# Patient Record
Sex: Female | Born: 1970 | Race: White | Hispanic: No | Smoking: Never smoker
Health system: Southern US, Community
[De-identification: ages and names within clinical notes are randomized; demographics above are authoritative.]

## PROBLEM LIST (undated history)

## (undated) DIAGNOSIS — R011 Cardiac murmur, unspecified: Secondary | ICD-10-CM

## (undated) DIAGNOSIS — D649 Anemia, unspecified: Secondary | ICD-10-CM

## (undated) HISTORY — PX: TUBAL LIGATION: SHX77

---

## 2015-03-14 DIAGNOSIS — Z411 Encounter for cosmetic surgery: Secondary | ICD-10-CM | POA: Insufficient documentation

## 2016-04-18 DIAGNOSIS — D500 Iron deficiency anemia secondary to blood loss (chronic): Secondary | ICD-10-CM | POA: Insufficient documentation

## 2016-04-18 DIAGNOSIS — N939 Abnormal uterine and vaginal bleeding, unspecified: Secondary | ICD-10-CM | POA: Insufficient documentation

## 2016-04-18 DIAGNOSIS — N926 Irregular menstruation, unspecified: Secondary | ICD-10-CM | POA: Insufficient documentation

## 2017-04-08 ENCOUNTER — Encounter (HOSPITAL_BASED_OUTPATIENT_CLINIC_OR_DEPARTMENT_OTHER): Payer: Self-pay | Admitting: Emergency Medicine

## 2017-04-08 ENCOUNTER — Other Ambulatory Visit: Payer: Self-pay

## 2017-04-08 ENCOUNTER — Emergency Department (HOSPITAL_BASED_OUTPATIENT_CLINIC_OR_DEPARTMENT_OTHER)
Admission: EM | Admit: 2017-04-08 | Discharge: 2017-04-09 | Disposition: A | Discharge status: Home / Self Care | Payer: Self-pay | Attending: Emergency Medicine | Admitting: Emergency Medicine

## 2017-04-08 DIAGNOSIS — W260XXA Contact with knife, initial encounter: Secondary | ICD-10-CM | POA: Insufficient documentation

## 2017-04-08 DIAGNOSIS — S61211A Laceration without foreign body of left index finger without damage to nail, initial encounter: Secondary | ICD-10-CM | POA: Insufficient documentation

## 2017-04-08 DIAGNOSIS — Y9389 Activity, other specified: Secondary | ICD-10-CM | POA: Insufficient documentation

## 2017-04-08 DIAGNOSIS — Y998 Other external cause status: Secondary | ICD-10-CM | POA: Insufficient documentation

## 2017-04-08 DIAGNOSIS — Y929 Unspecified place or not applicable: Secondary | ICD-10-CM | POA: Insufficient documentation

## 2017-04-08 MED ORDER — LIDOCAINE-EPINEPHRINE 2 %-1:100000 IJ SOLN
INTRAMUSCULAR | Status: AC
  Administered 2017-04-09: 1 mL
  Filled 2017-04-08: qty 1

## 2017-04-08 NOTE — ED Provider Notes (Signed)
[facility] Provider Note   CSN: [id number] Arrival date & time: 04/08/17  2159     History   Chief Complaint Chief Complaint  Patient presents with  . Extremity Laceration    HPI Ashley Shah is a 47 y.o. female.  47 yo F with a chief complaint of a finger laceration.  The patient was trying to open a package with a knife and she cut through the package and into her finger.  She has had trouble controlling the bleeding at home.  Tetanus is up-to-date.   Denies other injury.  The history is provided by the patient and the spouse.  Illness  This is a new problem. The current episode started 3 to 5 hours ago. The problem occurs constantly. The problem has not changed since onset.Pertinent negatives include no chest pain, no headaches and no shortness of breath. Nothing aggravates the symptoms. Nothing relieves the symptoms. She has tried nothing for the symptoms. The treatment provided no relief.    History reviewed. No pertinent past medical history.  There are no active problems to display for this patient.   History reviewed. No pertinent surgical history.   OB History   None      Home Medications    Prior to Admission medications   Not on File    Family History No family history on file.  Social History Social History   Tobacco Use  . Smoking status: Never Smoker  . Smokeless tobacco: Never Used  Substance Use Topics  . Alcohol use: Not Currently    Frequency: Never  . Drug use: Never     Allergies   Sulfa antibiotics   Review of Systems Review of Systems  Constitutional: Negative for chills and fever.  HENT: Negative for congestion and rhinorrhea.   Eyes: Negative for redness and visual disturbance.  Respiratory: Negative for shortness of breath and wheezing.   Cardiovascular: Negative for chest pain and palpitations.  Gastrointestinal: Negative for nausea and vomiting.  Genitourinary: Negative for dysuria  and urgency.  Musculoskeletal: Negative for arthralgias and myalgias.  Skin: Positive for wound. Negative for pallor.  Neurological: Negative for dizziness and headaches.     Physical Exam Updated Vital Signs BP 131/82 (BP Location: Right Arm)   Pulse 85   Temp 97.7 F (36.5 C) (Oral)   Resp 18   Ht 5\' 5"  (1.651 m)   Wt 59 kg (130 lb)   SpO2 100%   BMI 21.63 kg/m   Physical Exam  Constitutional: She is oriented to person, place, and time. She appears well-developed and well-nourished. No distress.  HENT:  Head: Normocephalic and atraumatic.  Eyes: Pupils are equal, round, and reactive to light. EOM are normal.  Neck: Normal range of motion. Neck supple.  Cardiovascular: Normal rate and regular rhythm. Exam reveals no gallop and no friction rub.  No murmur heard. Pulmonary/Chest: Effort normal. She has no wheezes. She has no rales.  Abdominal: Soft. She exhibits no distension. There is no tenderness.  Musculoskeletal: She exhibits no edema or tenderness.  3 cm laceration to the left index finger up under the nail sparing the nail bed no nail damage.  Neurological: She is alert and oriented to person, place, and time.  Skin: Skin is warm and dry. She is not diaphoretic.  Psychiatric: She has a normal mood and affect. Her behavior is normal.  Nursing note and vitals reviewed.    ED Treatments / Results  Labs (all labs ordered  are listed, but only abnormal results are displayed) Labs Reviewed - No data to display  EKG None  Radiology No results found.  Procedures .Marland KitchenLaceration Repair Date/Time: 04/08/2017 11:43 PM Performed by: Deno Etienne, DO Authorized by: Deno Etienne, DO   Consent:    Consent obtained:  Verbal   Consent given by:  Patient   Risks discussed:  Infection, pain, poor cosmetic result, poor wound healing, need for additional repair, nerve damage and vascular damage   Alternatives discussed:  No treatment, delayed treatment and observation Anesthesia  (see MAR for exact dosages):    Anesthesia method:  Nerve block   Block needle gauge:  25 G   Block anesthetic:  Lidocaine 2% WITH epi   Block technique:  Digital   Block injection procedure:  Anatomic landmarks identified and anatomic landmarks palpated   Block outcome:  Anesthesia achieved Laceration details:    Location:  Finger   Finger location:  L index finger Repair type:    Repair type:  Complex Pre-procedure details:    Preparation:  Patient was prepped and draped in usual sterile fashion Exploration:    Limited defect created (wound extended): no     Hemostasis achieved with:  Direct pressure and epinephrine   Wound exploration: entire depth of wound probed and visualized     Contaminated: no   Treatment:    Area cleansed with:  Soap and water   Amount of cleaning:  Extensive   Irrigation solution:  Tap water   Irrigation method:  Tap   Visualized foreign bodies/material removed: no     Debridement:  Minimal   Undermining:  None   Scar revision: no   Skin repair:    Repair method:  Sutures   Suture size:  4-0   Suture material:  Nylon   Suture technique:  Simple interrupted   Number of sutures:  5 Approximation:    Approximation:  Close Post-procedure details:    Dressing:  Open (no dressing)   Patient tolerance of procedure:  Tolerated well, no immediate complications .Nerve Block Date/Time: 04/08/2017 11:45 PM Performed by: Deno Etienne, DO Authorized by: Deno Etienne, DO   Consent:    Consent obtained:  Verbal   Consent given by:  Patient   Risks discussed:  Allergic reaction, infection, intravenous injection, bleeding, pain and nerve damage   Alternatives discussed:  No treatment and alternative treatment Indications:    Indications:  Procedural anesthesia Location:    Body area:  Upper extremity   Upper extremity nerve blocked: digital block.   Laterality:  Left Pre-procedure details:    Skin preparation:  2% chlorhexidine   Preparation: Patient was  prepped and draped in usual sterile fashion   Skin anesthesia (see MAR for exact dosages):    Skin anesthesia method:  Local infiltration   Local anesthetic:  Lidocaine 2% WITH epi Procedure details (see MAR for exact dosages):    Block needle gauge:  25 G   Anesthetic injected:  Lidocaine 2% WITH epi   Steroid injected:  None   Additive injected:  None   Injection procedure:  Anatomic landmarks identified, anatomic landmarks palpated and negative aspiration for blood   Paresthesia:  None Post-procedure details:    Dressing:  None   Outcome:  Anesthesia achieved   Patient tolerance of procedure:  Tolerated well, no immediate complications   (including critical care time)  Medications Ordered in ED Medications  lidocaine-EPINEPHrine (XYLOCAINE W/EPI) 2 %-1:100000 (with pres) injection (has no administration in time  range)     Initial Impression / Assessment and Plan / ED Course  I have reviewed the triage vital signs and the nursing notes.  Pertinent labs & imaging results that were available during my care of the patient were reviewed by me and considered in my medical decision making (see chart for details).     46 yo F with a finger laceration.  This is just underneath the nail.  Not a evolving the nailbed or the nail itself.  Suture through the nail, PCP follow-up.  11:48 PM:  I have discussed the diagnosis/risks/treatment options with the patient and family and believe the pt to be eligible for discharge home to follow-up with PCP. We also discussed returning to the ED immediately if new or worsening sx occur. We discussed the sx which are most concerning (e.g., sudden worsening pain, fever, inability to tolerate by mouth) that necessitate immediate return. Medications administered to the patient during their visit and any new prescriptions provided to the patient are listed below.  Medications given during this visit Medications  lidocaine-EPINEPHrine (XYLOCAINE W/EPI) 2  %-1:100000 (with pres) injection (has no administration in time range)     The patient appears reasonably screen and/or stabilized for discharge and I doubt any other medical condition or other [facility] requiring further screening, evaluation, or treatment in the ED at this time prior to discharge.    Final Clinical Impressions(s) / ED Diagnoses   Final diagnoses:  Laceration of left index finger without foreign body without damage to nail, initial encounter    ED Discharge Orders    None       Deno Etienne, DO 04/08/17 2348

## 2017-04-08 NOTE — ED Triage Notes (Signed)
Pt cut left index finger with knife

## 2017-04-09 NOTE — ED Notes (Signed)
Dry dressing applied to left index finger. No bleeding noted.

## 2018-07-24 ENCOUNTER — Encounter (HOSPITAL_BASED_OUTPATIENT_CLINIC_OR_DEPARTMENT_OTHER): Payer: Self-pay

## 2018-07-24 ENCOUNTER — Other Ambulatory Visit: Payer: Self-pay

## 2018-07-24 ENCOUNTER — Emergency Department (HOSPITAL_BASED_OUTPATIENT_CLINIC_OR_DEPARTMENT_OTHER)
Admission: EM | Admit: 2018-07-24 | Discharge: 2018-07-25 | Disposition: A | Discharge status: Home / Self Care | Payer: Self-pay | Attending: Emergency Medicine | Admitting: Emergency Medicine

## 2018-07-24 DIAGNOSIS — N7011 Chronic salpingitis: Secondary | ICD-10-CM

## 2018-07-24 DIAGNOSIS — N83202 Unspecified ovarian cyst, left side: Secondary | ICD-10-CM | POA: Insufficient documentation

## 2018-07-24 DIAGNOSIS — D259 Leiomyoma of uterus, unspecified: Secondary | ICD-10-CM

## 2018-07-24 DIAGNOSIS — R1032 Left lower quadrant pain: Secondary | ICD-10-CM

## 2018-07-24 DIAGNOSIS — R102 Pelvic and perineal pain: Secondary | ICD-10-CM

## 2018-07-24 NOTE — ED Triage Notes (Signed)
Pt c/o severe lower abd pain with nausea that started today. Pt currently taking Cipro for a bladder infection. Pt denies urinary symptoms.

## 2018-07-25 ENCOUNTER — Emergency Department (HOSPITAL_BASED_OUTPATIENT_CLINIC_OR_DEPARTMENT_OTHER): Payer: Self-pay

## 2018-07-25 ENCOUNTER — Emergency Department (HOSPITAL_COMMUNITY): Payer: Self-pay

## 2018-07-25 LAB — CBC WITH DIFFERENTIAL/PLATELET
Abs Immature Granulocytes: 0.02 10*3/uL (ref 0.00–0.07)
Basophils Absolute: 0 10*3/uL (ref 0.0–0.1)
Basophils Relative: 1 %
Eosinophils Absolute: 0.1 10*3/uL (ref 0.0–0.5)
Eosinophils Relative: 1 %
HCT: 35.7 % — ABNORMAL LOW (ref 36.0–46.0)
Hemoglobin: 11.1 g/dL — ABNORMAL LOW (ref 12.0–15.0)
Immature Granulocytes: 0 %
Lymphocytes Relative: 32 %
Lymphs Abs: 2.5 10*3/uL (ref 0.7–4.0)
MCH: 26.6 pg (ref 26.0–34.0)
MCHC: 31.1 g/dL (ref 30.0–36.0)
MCV: 85.6 fL (ref 80.0–100.0)
Monocytes Absolute: 0.7 10*3/uL (ref 0.1–1.0)
Monocytes Relative: 9 %
Neutro Abs: 4.5 10*3/uL (ref 1.7–7.7)
Neutrophils Relative %: 57 %
Platelets: 382 10*3/uL (ref 150–400)
RBC: 4.17 MIL/uL (ref 3.87–5.11)
RDW: 14.2 % (ref 11.5–15.5)
WBC: 7.8 10*3/uL (ref 4.0–10.5)
nRBC: 0 % (ref 0.0–0.2)

## 2018-07-25 LAB — URINALYSIS, ROUTINE W REFLEX MICROSCOPIC
Bilirubin Urine: NEGATIVE
Glucose, UA: NEGATIVE mg/dL
Hgb urine dipstick: NEGATIVE
Ketones, ur: NEGATIVE mg/dL
Leukocytes,Ua: NEGATIVE
Nitrite: NEGATIVE
Protein, ur: NEGATIVE mg/dL
Specific Gravity, Urine: >1.03 — ABNORMAL HIGH (ref 1.005–1.030)
pH: 5.5 (ref 5.0–8.0)

## 2018-07-25 LAB — BASIC METABOLIC PANEL
Anion gap: 11 (ref 5–15)
BUN: 13 mg/dL (ref 6–20)
CO2: 24 mmol/L (ref 22–32)
Calcium: 9.5 mg/dL (ref 8.9–10.3)
Chloride: 102 mmol/L (ref 98–111)
Creatinine, Ser: 0.93 mg/dL (ref 0.44–1.00)
GFR calc Af Amer: >60 mL/min (ref >60)
GFR calc non Af Amer: >60 mL/min (ref >60)
Glucose, Bld: 102 mg/dL — ABNORMAL HIGH (ref 70–99)
Potassium: 3.3 mmol/L — ABNORMAL LOW (ref 3.5–5.1)
Sodium: 137 mmol/L (ref 135–145)

## 2018-07-25 LAB — PREGNANCY, URINE: Preg Test, Ur: NEGATIVE

## 2018-07-25 MED ORDER — ONDANSETRON HCL 4 MG/2ML IJ SOLN
4.0000 mg | Freq: Once | INTRAMUSCULAR | Status: AC
  Administered 2018-07-25: 4 mg via INTRAVENOUS
  Filled 2018-07-25: qty 2

## 2018-07-25 MED ORDER — KETOROLAC TROMETHAMINE 30 MG/ML IJ SOLN
30.0000 mg | Freq: Once | INTRAMUSCULAR | Status: AC
  Administered 2018-07-25: 30 mg via INTRAVENOUS
  Filled 2018-07-25: qty 1

## 2018-07-25 MED ORDER — GADOBUTROL 1 MMOL/ML IV SOLN
6.0000 mL | Freq: Once | INTRAVENOUS | Status: AC | PRN
  Administered 2018-07-25: 6 mL via INTRAVENOUS

## 2018-07-25 MED ORDER — HYDROMORPHONE HCL 1 MG/ML IJ SOLN
1.0000 mg | Freq: Once | INTRAMUSCULAR | Status: AC
  Administered 2018-07-25: 1 mg via INTRAVENOUS
  Filled 2018-07-25: qty 1

## 2018-07-25 MED ORDER — ONDANSETRON 4 MG PO TBDP: 4.0000 mg | ORAL_TABLET | Freq: Three times a day (TID) | ORAL | 0 refills | Status: DC | PRN

## 2018-07-25 MED ORDER — FENTANYL CITRATE (PF) 100 MCG/2ML IJ SOLN
50.0000 ug | Freq: Once | INTRAMUSCULAR | Status: AC
  Administered 2018-07-25: 50 ug via INTRAVENOUS
  Filled 2018-07-25: qty 2

## 2018-07-25 MED ORDER — SODIUM CHLORIDE 0.9 % IV BOLUS
1000.0000 mL | Freq: Once | INTRAVENOUS | Status: AC
  Administered 2018-07-25: 1000 mL via INTRAVENOUS

## 2018-07-25 MED ORDER — HYDROMORPHONE HCL 1 MG/ML IJ SOLN
1.0000 mg | INTRAMUSCULAR | Status: DC | PRN
  Administered 2018-07-25 (×2): 1 mg via INTRAVENOUS
  Filled 2018-07-25 (×2): qty 1

## 2018-07-25 MED ORDER — ONDANSETRON HCL 4 MG/2ML IJ SOLN
4.0000 mg | Freq: Four times a day (QID) | INTRAMUSCULAR | Status: DC | PRN
  Administered 2018-07-25 (×2): 4 mg via INTRAVENOUS
  Filled 2018-07-25 (×2): qty 2

## 2018-07-25 MED ORDER — OXYCODONE HCL 5 MG PO TABS: 5.0000 mg | ORAL_TABLET | ORAL | 0 refills | Status: DC | PRN

## 2018-07-25 MED ORDER — MORPHINE SULFATE (PF) 4 MG/ML IV SOLN
4.0000 mg | Freq: Once | INTRAVENOUS | Status: AC
  Administered 2018-07-25: 4 mg via INTRAVENOUS
  Filled 2018-07-25: qty 1

## 2018-07-25 NOTE — ED Provider Notes (Signed)
  Provider Note MRN:  [id number]  Arrival date & time: 07/25/18    ED Course and Medical Decision Making  Assumed care from Dr. Leonides Schanz at shift change.  48 year old female with lower pelvic pain, large ovarian cyst found on ultrasound, continued concern for possible ovarian torsion.  Will need MRI of the pelvis, awaiting results.  2:30 PM update: Unfortunate delays in obtaining MRI results.  With LOC, MRI is not consistent with ovarian torsion.  Pain is best explained by hydrosalpinx and possibly also contributing is patient's large fibroids.  MRI mentions the possibility of infection but the radiologist felt this was unlikely.  Patient has no signs or symptoms of infection.  She is appropriate for outpatient GYN follow-up and symptomatic management.  After the discussed management above, the patient was determined to be safe for discharge.  The patient was in agreement with this plan and all questions regarding their care were answered.  ED return precautions were discussed and the patient will return to the ED with any significant worsening of condition.    Final Clinical Impressions(s) / ED Diagnoses     ICD-10-CM   1. Left ovarian cyst  N83.202   2. LLQ pain  R10.32 US PELVIC DOPPLER (TORSION R/O OR MASS ARTERIAL FLOW)    US PELVIS TRANSVAGINAL NON-OB (TV ONLY)    ED Discharge Orders    None       Barth Kirks. Sedonia Small, [place] [email or web address]    Maudie Flakes, MD 07/25/18 [phone]

## 2018-07-25 NOTE — Discharge Instructions (Signed)
You were evaluated in the Emergency Department and after careful evaluation, we did not find any emergent condition requiring admission or further testing in the hospital.  Your symptoms today seem to be due to a hydrosalpinx as well as uterine fibroids.  Your ovaries appear to have normal blood flow.  We recommend close GYN follow-up for further management considerations.  Please use the pain and nausea medication as needed at home.  Please return to the Emergency Department if you experience any worsening of your condition.  We encourage you to follow up with a primary care provider.  Thank you for allowing [place] to be a part of your care.

## 2018-07-25 NOTE — ED Notes (Signed)
Pt vomiting.

## 2018-07-25 NOTE — ED Notes (Signed)
Pt transported to MRI 

## 2018-07-25 NOTE — ED Provider Notes (Addendum)
[facility] Provider Note   CSN: [id number] Arrival date & time: 07/24/18  2324    History   Chief Complaint Chief Complaint  Patient presents with   Abdominal Pain    HPI Ashley Shah is a 48 y.o. female.     HPI  This is a 48 year old female who presents with left lower quadrant pain.  Patient reports onset of pain earlier this evening.  She states that she has pain in her left lower quadrant and her left flank.  It is sharp and comes and goes.  She has had similar pain in the past that she has associated with her.  But she is not currently on her period.  She reports nausea without vomiting.  Patient states that she has had recent recurrent urinary tract infections.  She just started ciprofloxacin on Thursday for a presumed urinary tract infection but did not have urine taken.  Patient rates her pain at 10 out of 10.  She states "it is as bad as when I had a ruptured ectopic."  No known history of kidney stones.  Patient took Midol with minimal relief.  Denies fevers but reports chills.  History reviewed. No pertinent past medical history.  There are no active problems to display for this patient.   Past Surgical History:  Procedure Laterality Date   TUBAL LIGATION       OB History   No obstetric history on file.      Home Medications    Prior to Admission medications   Medication Sig Start Date End Date Taking? Authorizing Provider  zolpidem (AMBIEN) 10 MG tablet Take 10 mg by mouth at bedtime as needed for sleep.    [provider]    Family History No family history on file.  Social History Social History   Tobacco Use   Smoking status: Never Smoker   Smokeless tobacco: Never Used  Substance Use Topics   Alcohol use: Not Currently    Frequency: Never   Drug use: Never     Allergies   Sulfa antibiotics   Review of Systems Review of Systems  Constitutional: Negative for fever.  Cardiovascular:  Negative for chest pain.  Gastrointestinal: Positive for abdominal pain and nausea. Negative for diarrhea and vomiting.  Genitourinary: Positive for dysuria and flank pain.  Musculoskeletal: Negative for back pain.     Physical Exam Updated Vital Signs BP 133/88 (BP Location: Left Arm)    Pulse 94    Temp 98.1 F (36.7 C) (Oral)    Resp (!) 24    Ht 1.651 m (5\' 5" )    Wt 61.2 kg    LMP 07/05/2018    SpO2 100%    BMI 22.47 kg/m   Physical Exam Vitals signs and nursing note reviewed.  Constitutional:      Appearance: She is well-developed. She is not ill-appearing.  HENT:     Head: Normocephalic and atraumatic.  Neck:     Musculoskeletal: Neck supple.  Cardiovascular:     Rate and Rhythm: Normal rate and regular rhythm.     Heart sounds: Normal heart sounds.  Pulmonary:     Effort: Pulmonary effort is normal. No respiratory distress.     Breath sounds: No wheezing.  Abdominal:     General: Bowel sounds are normal.     Palpations: Abdomen is soft.     Tenderness: There is abdominal tenderness in the left lower quadrant. There is no right CVA tenderness, left  CVA tenderness, guarding or rebound.  Skin:    General: Skin is warm and dry.  Neurological:     Mental Status: She is alert and oriented to person, place, and time.  Psychiatric:        Mood and Affect: Mood normal.      ED Treatments / Results  Labs (all labs ordered are listed, but only abnormal results are displayed) Labs Reviewed  URINALYSIS, ROUTINE W REFLEX MICROSCOPIC - Abnormal; Notable for the following components:      Result Value   APPearance HAZY (*)    Specific Gravity, Urine >1.030 (*)    All other components within normal limits  CBC WITH DIFFERENTIAL/PLATELET - Abnormal; Notable for the following components:   Hemoglobin 11.1 (*)    HCT 35.7 (*)    All other components within normal limits  BASIC METABOLIC PANEL - Abnormal; Notable for the following components:   Potassium 3.3 (*)    Glucose,  Bld 102 (*)    All other components within normal limits  PREGNANCY, URINE    EKG None  Radiology Ct Renal Stone Study  Result Date: 07/25/2018 CLINICAL DATA:  48 year old female with left lower quadrant and flank pain. EXAM: CT ABDOMEN AND PELVIS WITHOUT CONTRAST TECHNIQUE: Multidetector CT imaging of the abdomen and pelvis was performed following the standard protocol without IV contrast. COMPARISON:  None. FINDINGS: Evaluation of this exam is limited in the absence of intravenous contrast. Lower chest: The visualized lung bases are clear. No intra-abdominal free air or free fluid. Hepatobiliary: Subcentimeter hypodense focus in the dome of the liver is too small to characterize. The liver is otherwise unremarkable. No intrahepatic biliary ductal dilatation. There is a stone in the gallbladder. No pericholecystic fluid or evidence of acute cholecystitis by CT. Ultrasound may provide better evaluation if there is high clinical concern for acute cholecystitis. Pancreas: Unremarkable. No pancreatic ductal dilatation or surrounding inflammatory changes. Spleen: Normal in size without focal abnormality. Adrenals/Urinary Tract: The adrenal glands, kidneys, and visualized ureters appear unremarkable. The urinary bladder is collapsed. Stomach/Bowel: Small hiatal hernia. There is no bowel obstruction or active inflammation. The appendix is normal. Vascular/Lymphatic: The abdominal aorta and IVC are grossly unremarkable on this noncontrast CT. No portal venous gas. There is no adenopathy. Reproductive: The uterus is enlarged with multiple fibroids. The largest fibroid is in the posterior body measuring up to 6.5 cm with possible some degenerative changes. There is a 6 cm left ovarian cyst or cystic lesion. MRI or pelvic ultrasound may provide better evaluation uterus and the adnexa on a nonemergent basis. Other: Small fat containing umbilical hernia. Musculoskeletal: Mild levoscoliosis. No acute osseous  pathology. IMPRESSION: 1. No acute intra-abdominal or pelvic pathology. No bowel obstruction or active inflammation. Normal appendix. 2. Enlarged myomatous uterus. A 6 cm left ovarian cyst or cystic lesion. MRI or pelvic ultrasound may provide better evaluation uterus and the adnexa on a nonemergent basis. 3. Cholelithiasis. 4. No hydronephrosis or nephrolithiasis. Electronically Signed   By: Anner Crete M.D.   On: 07/25/2018 01:11    Procedures Procedures (including critical care time)  Medications Ordered in ED Medications  sodium chloride 0.9 % bolus 1,000 mL (0 mLs Intravenous Stopped 07/25/18 0136)  ondansetron (ZOFRAN) injection 4 mg (4 mg Intravenous Given 07/25/18 0024)  ketorolac (TORADOL) 30 MG/ML injection 30 mg (30 mg Intravenous Given 07/25/18 0024)  morphine 4 MG/ML injection 4 mg (4 mg Intravenous Given 07/25/18 0125)     Initial Impression / Assessment and  Plan / ED Course  I have reviewed the triage vital signs and the nursing notes.  Pertinent labs & imaging results that were available during my care of the patient were reviewed by me and considered in my medical decision making (see chart for details).        Patient presents with left lower quadrant pain.  She is overall nontoxic-appearing.  Vital signs are reassuring.  Fairly acute in onset.  Considerations include kidney stone, UTI, ovarian pathology.  Patient was given pain and nausea medication.  She was given fluids.  Urinalysis without obvious UTI.  She is currently on ciprofloxacin.  No significant leukocytosis.  CT stone study is negative for kidney stone but does show a large left ovarian cyst.  Patient is having ongoing and intermittent pain.  Given size of the cyst, she is at risk for ovarian torsion.  Discussed transfer to [facility] for an ultrasound to rule out torsion.  Patient is clinically stable and requesting to go by private vehicle.  I feel this is reasonable and her husband will transport her.  We  discussed risks and benefits of transport.  Final Clinical Impressions(s) / ED Diagnoses   Final diagnoses:  LLQ pain  Left ovarian cyst    ED Discharge Orders    None       Kamyrah Feeser, Barbette Hair, MD 07/25/18 0151    Merryl Hacker, MD 07/25/18 [phone]

## 2018-07-25 NOTE — ED Provider Notes (Signed)
4:45 AM Patient is a 48 year old female who was transferred from [place] with complaints of sudden onset left-sided pelvic pain.  Described as feeling similar to her previous ectopic pregnancy that she has had on the right side.  CT scan at [facility] showed a 6 cm cystic lesion around the left ovary.  Sent here for ultrasound to rule out torsion.  Ultrasound performed does not visualize the left ovary secondary to an enlarged fibroid uterus.  Patient does not have a local OB/GYN.  She reports her pain is poorly controlled after Toradol, fentanyl and morphine.  Will give Dilaudid and Zofran.  D/w Dr. Elly Modena on call for unassigned OB/GYN.  Appreciate her help.  She states that if this was an ovarian cyst, they do not normally admit patients for pain control or perform cystectomies normally in the hospital.  This would be outpatient follow-up.  Unfortunately at this time we have not ruled out torsion.  D/w radiology.  Appreciate radiology help.  He states that a transabdominal ultrasound could be performed to rule out torsion but this would need to be done with a full bladder.  He states it may be faster to perform a pelvic MRI with and without contrast to rule out torsion and that this would not be an acceptable study to rule torsion out today.  Patient comfortable with this plan will be kept n.p.o. at this time.  7:40 AM  Pt awaiting pelvic MRI.  We have talked to MRI technician as we are trying to rule out surgical emergent pathology.  Patient will be taken to MRI next.  Patient's pain has been controlled with IV Dilaudid.  Signed out to Dr. Sedonia Small.  I reviewed all nursing notes, vitals, pertinent previous records, EKGs, lab and urine results, imaging (as available).    Cassiopeia Florentino, Delice Bison, DO 07/25/18 [phone]

## 2018-07-25 NOTE — ED Notes (Signed)
Waiting on preg results prior to CT scan per radiology protocol

## 2018-07-25 NOTE — ED Notes (Signed)
MD notified of pt's request for pain medication

## 2018-07-25 NOTE — ED Notes (Signed)
Report given to Joanell Rising, RN [facility] ed triage nurse. Illene Silver, Charge RN made aware of POV transfer

## 2018-07-25 NOTE — ED Notes (Signed)
ED Provider at bedside. 

## 2018-07-25 NOTE — ED Notes (Signed)
Patient verbalizes understanding of discharge instructions. Opportunity for questioning and answers were provided. Armband removed by staff, pt discharged from ED.  

## 2019-01-24 ENCOUNTER — Encounter: Payer: Self-pay | Admitting: Obstetrics & Gynecology

## 2019-02-07 ENCOUNTER — Other Ambulatory Visit: Payer: Self-pay

## 2019-02-07 ENCOUNTER — Ambulatory Visit: Payer: 59 | Admitting: Obstetrics & Gynecology

## 2019-02-07 ENCOUNTER — Other Ambulatory Visit: Payer: Self-pay | Admitting: Obstetrics & Gynecology

## 2019-02-07 ENCOUNTER — Encounter: Payer: Self-pay | Admitting: Obstetrics & Gynecology

## 2019-02-07 VITALS — BP 118/77 | HR 108 | Temp 98.4°F | Resp 16 | Ht 65.0 in | Wt 143.0 lb

## 2019-02-07 DIAGNOSIS — Z124 Encounter for screening for malignant neoplasm of cervix: Secondary | ICD-10-CM

## 2019-02-07 DIAGNOSIS — Z3202 Encounter for pregnancy test, result negative: Secondary | ICD-10-CM | POA: Diagnosis not present

## 2019-02-07 DIAGNOSIS — Z1151 Encounter for screening for human papillomavirus (HPV): Secondary | ICD-10-CM | POA: Diagnosis not present

## 2019-02-07 DIAGNOSIS — Z113 Encounter for screening for infections with a predominantly sexual mode of transmission: Secondary | ICD-10-CM | POA: Diagnosis not present

## 2019-02-07 DIAGNOSIS — D251 Intramural leiomyoma of uterus: Secondary | ICD-10-CM

## 2019-02-07 DIAGNOSIS — N926 Irregular menstruation, unspecified: Secondary | ICD-10-CM | POA: Diagnosis not present

## 2019-02-07 DIAGNOSIS — N898 Other specified noninflammatory disorders of vagina: Secondary | ICD-10-CM

## 2019-02-07 DIAGNOSIS — Z01812 Encounter for preprocedural laboratory examination: Secondary | ICD-10-CM

## 2019-02-07 DIAGNOSIS — R102 Pelvic and perineal pain: Secondary | ICD-10-CM

## 2019-02-07 DIAGNOSIS — D259 Leiomyoma of uterus, unspecified: Secondary | ICD-10-CM | POA: Insufficient documentation

## 2019-02-07 LAB — POCT URINE PREGNANCY: Preg Test, Ur: NEGATIVE

## 2019-02-07 NOTE — Progress Notes (Signed)
   Subjective:    Patient ID: Ashley Shah, female    DOB: [birth year], 49 y.o.   MRN: [id number]  HPI  Pt presents for pain after hospitalization in July 2020.  Pt known to have fibroid tumors of the uterus.  Pt has pain and heavy menses.  Pt's last menses was 2 weeks with spotting being the last week. Pt would like a hysterectomy and is considering BSO.   Pt has history of RSO due to ruptured ectopic.  Pt has not taken anything for the bleeding or pain.  Pt does have recurrent UTIs and recenly took cipro (PCP).    Review of Systems  Constitutional: Negative.   Respiratory: Negative.   Cardiovascular: Negative.   Gastrointestinal: Negative.   Genitourinary: Positive for dyspareunia, pelvic pain, vaginal bleeding and vaginal discharge.  Psychiatric/Behavioral: Negative.        Objective:   Physical Exam Vitals reviewed.  Constitutional:      General: She is not in acute distress.    Appearance: She is well-developed.  HENT:     Head: Normocephalic and atraumatic.  Eyes:     Conjunctiva/sclera: Conjunctivae normal.  Cardiovascular:     Rate and Rhythm: Normal rate.  Pulmonary:     Effort: Pulmonary effort is normal.  Abdominal:     General: Abdomen is flat. There is no distension.     Palpations: Abdomen is soft. There is no mass.     Tenderness: There is no abdominal tenderness. There is no guarding or rebound.  Genitourinary:    Comments: T[place] V Vagina:  Pink normal rugae, white thick discharge Cervix:  Very anterior, no lesion Uterus: Enlarged with posterior fibroid Adnexa:  No tenderness; exam limited by fibroid Skin:    General: Skin is warm and dry.  Neurological:     Mental Status: She is alert and oriented to person, place, and time.       Assessment & Plan:  49 yo female with chronic pelvic pain from enlarge fibroid uterus and  Left adnexal mass (MRI on chart form July 2020).  Pt would like hysterectomy and considering BSO. 1.  Pap smear.  Unclear when  she last had a pap smear.  Saw Angela Cox who may have done one. 2.  Endometrial biopsy:  Pt has had irregular menses. 3.  Will schedule with Dr. Ihor Dow.  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     40 mins spent face to face with patient with greater than 50% counseling.

## 2019-02-08 LAB — CYTOLOGY - PAP
Adequacy: ABSENT
Comment: NEGATIVE
Diagnosis: NEGATIVE
High risk HPV: NEGATIVE

## 2019-02-08 LAB — CERVICOVAGINAL ANCILLARY ONLY
Bacterial Vaginitis (gardnerella): NEGATIVE
Candida Glabrata: NEGATIVE
Candida Vaginitis: NEGATIVE
Chlamydia: NEGATIVE
Comment: NEGATIVE
Comment: NEGATIVE
Comment: NEGATIVE
Comment: NEGATIVE
Comment: NEGATIVE
Comment: NORMAL
Neisseria Gonorrhea: NEGATIVE
Trichomonas: NEGATIVE

## 2019-02-10 ENCOUNTER — Telehealth: Payer: Self-pay | Admitting: *Deleted

## 2019-02-10 NOTE — Telephone Encounter (Signed)
LM on voicemail of Endometrial biopsy results.  Polyp, and that Dr Maylene Roes would review and discuss at her next appt.

## 2019-02-10 NOTE — Telephone Encounter (Signed)
-----   Message from Guss Bunde, MD sent at 02/09/2019 12:55 PM EST ----- Diagnosis Endometrium, biopsy - ENDOMETRIOID-TYPE POLYP, BENIGN. Pt does not have my chart.  RN to call patient and tell results and that Dr. Ihor Dow will talk about it with her further at her appointment.

## 2019-02-17 ENCOUNTER — Encounter: Payer: Self-pay | Admitting: Obstetrics & Gynecology

## 2019-02-17 ENCOUNTER — Telehealth: Payer: Self-pay

## 2019-02-17 ENCOUNTER — Ambulatory Visit (INDEPENDENT_AMBULATORY_CARE_PROVIDER_SITE_OTHER): Payer: 59 | Admitting: Obstetrics & Gynecology

## 2019-02-17 ENCOUNTER — Other Ambulatory Visit: Payer: Self-pay

## 2019-02-17 VITALS — BP 113/78 | HR 94 | Ht 65.0 in | Wt 140.0 lb

## 2019-02-17 DIAGNOSIS — D219 Benign neoplasm of connective and other soft tissue, unspecified: Secondary | ICD-10-CM

## 2019-02-17 DIAGNOSIS — R102 Pelvic and perineal pain: Secondary | ICD-10-CM | POA: Diagnosis not present

## 2019-02-17 NOTE — Telephone Encounter (Signed)
Pt called the office wanting to discuss scheduling surgery. Pt made aware that she will receive a letter or a phone when the surgery is scheduled.Understanding was voiced. Tanny Harnack l Odus Clasby, CMA

## 2019-02-17 NOTE — Patient Instructions (Signed)
Total Laparoscopic Hysterectomy °A total laparoscopic hysterectomy is a minimally invasive surgery to remove the uterus and cervix. The fallopian tubes and ovaries can also be removed (bilateral salpingo-oophorectomy) during this surgery, if necessary. This procedure may be done to treat problems such as: °· Noncancerous growths in the uterus (uterine fibroids) that cause symptoms. °· A condition that causes the lining of the uterus (endometrium) to grow in other areas (endometriosis). °· Problems with pelvic support. This is caused by weakened muscles of the pelvis following vaginal childbirth or menopause. °· Cancer of the cervix, ovaries, uterus, or endometrium. °· Excessive (dysfunctional) uterine bleeding. °This surgery is performed by inserting a thin, lighted tube (laparoscope) and surgical instruments into small incisions in the abdomen. The laparoscope sends images to a monitor. The images help the health care provider perform the procedure. After this procedure, you will no longer be able to have a baby, and you will no longer have a menstrual period. °Tell a health care provider about: °· Any allergies you have. °· All medicines you are taking, including vitamins, herbs, eye drops, creams, and over-the-counter medicines. °· Any problems you or family members have had with anesthetic medicines. °· Any blood disorders you have. °· Any surgeries you have had. °· Any medical conditions you have. °· Whether you are pregnant or may be pregnant. °What are the risks? °Generally, this is a safe procedure. However, problems may occur, including: °· Infection. °· Bleeding. °· Blood clots in the legs or lungs. °· Allergic reactions to medicines. °· Damage to other structures or organs. °· The risk that the surgery may have to be switched to the regular one in which a large incision is made in the abdomen (abdominal hysterectomy). °What happens before the procedure? °Staying hydrated °Follow instructions from your  health care provider about hydration, which may include: °· Up to 2 hours before the procedure - you may continue to drink clear liquids, such as water, clear fruit juice, black coffee, and plain tea °Eating and drinking restrictions °Follow instructions from your health care provider about eating and drinking, which may include: °· 8 hours before the procedure - stop eating heavy meals or foods such as meat, fried foods, or fatty foods. °· 6 hours before the procedure - stop eating light meals or foods, such as toast or cereal. °· 6 hours before the procedure - stop drinking milk or drinks that contain milk. °· 2 hours before the procedure - stop drinking clear liquids. °Medicines °· Ask your health care provider about: °? Changing or stopping your regular medicines. This is especially important if you are taking diabetes medicines or blood thinners. °? Taking over-the-counter medicines, vitamins, herbs, and supplements. °? Taking medicines such as aspirin and ibuprofen. These medicines can thin your blood. Do not take these medicines unless your health care provider tells you to take them. °· You may be given antibiotic medicine to help prevent infection. °· You may be asked to take laxatives. °· You may be given medicines to help prevent nausea and vomiting after the procedure. °General instructions °· Ask your health care provider how your surgical site will be marked or identified. °· You may be asked to shower with a germ-killing soap. °· Do not use any products that contain nicotine or tobacco, such as cigarettes and e-cigarettes. If you need help quitting, ask your health care provider. °· You may have an exam or testing, such as an ultrasound to determine the size and shape of your pelvic organs. °·   You may have a blood or urine sample taken. °· This procedure can affect the way you feel about yourself. Talk with your health care provider about the physical and emotional changes hysterectomy may  cause. °· Plan to have someone take you home from the hospital or clinic. °· Plan to have a responsible adult care for you for at least 24 hours after you leave the hospital or clinic. This is important. °What happens during the procedure? °· To lower your risk of infection: °? Your health care team will wash or sanitize their hands. °? Your skin will be washed with soap. °? Hair may be removed from the surgical area. °· An IV will be inserted into one of your veins. °· You will be given one or more of the following: °? A medicine to help you relax (sedative). °? A medicine to make you fall asleep (general anesthetic). °· You will be given antibiotic medicine through your IV. °· A tube may be inserted down your throat to help you breathe during the procedure. °· A gas (carbon dioxide) will be used to inflate your abdomen to allow your surgeon to see inside of your abdomen. °· Three or four small incisions will be made in your abdomen. °· A laparoscope will be inserted into one of your incisions. Surgical instruments will be inserted through the other incisions in order to perform the procedure. °· Your uterus and cervix may be removed through your vagina or cut into small pieces and removed through the small incisions. Any other organs that need to be removed will also be removed this way. °· Carbon dioxide will be released from inside of your abdomen. °· Your incisions will be closed with stitches (sutures). °· A bandage (dressing) may be placed over your incisions. °The procedure may vary among health care providers and hospitals. °What happens after the procedure? °· Your blood pressure, heart rate, breathing rate, and blood oxygen level will be monitored until the medicines you were given have worn off. °· You will be given medicine for pain and nausea as needed. °· Do not drive for 24 hours if you received a sedative. °Summary °· Total Laparoscopic hysterectomy is a procedure to remove your uterus, cervix and  sometimes the fallopian tubes and ovaries. °· This procedure can affect the way you feel about yourself. Talk with your health care provider about the physical and emotional changes hysterectomy may cause. °· After this procedure, you will no longer be able to have a baby, and you will no longer have a menstrual period. °· You will be given pain medicine to control discomfort after this procedure. °This information is not intended to replace advice given to you by your health care provider. Make sure you discuss any questions you have with your health care provider. °Document Revised: 12/05/2016 Document Reviewed: 03/05/2016 °Elsevier Patient Education © 2020 [employer] °Total Laparoscopic Hysterectomy, Care After °This sheet gives you information about how to care for yourself after your procedure. Your health care provider may also give you more specific instructions. If you have problems or questions, contact your health care provider. °What can I expect after the procedure? °After the procedure, it is common to have: °· Pain and bruising around your incisions. °· A sore throat, if a breathing tube was used during surgery. °· Fatigue. °· Poor appetite. °· Less interest in sex. °If your ovaries were also removed, it is also common to have symptoms of menopause such as hot flashes, night sweats,   and lack of sleep (insomnia). °Follow these instructions at home: °Bathing °· Do not take baths, swim, or use a hot tub until your health care provider approves. You may need to only take showers for 2-3 weeks. °· Keep your bandage (dressing) dry until your health care provider says it can be removed. °Incision care ° °· Follow instructions from your health care provider about how to take care of your incisions. Make sure you: °? Wash your hands with soap and water before you change your dressing. If soap and water are not available, use hand sanitizer. °? Change your dressing as told by your health care  provider. °? Leave stitches (sutures), skin glue, or adhesive strips in place. These skin closures may need to stay in place for 2 weeks or longer. If adhesive strip edges start to loosen and curl up, you may trim the loose edges. Do not remove adhesive strips completely unless your health care provider tells you to do that. °· Check your incision area every day for signs of infection. Check for: °? Redness, swelling, or pain. °? Fluid or blood. °? Warmth. °? Pus or a bad smell. °Activity °· Get plenty of rest and sleep. °· Do not lift anything that is heavier than 10 lbs (4.5 kg) for one month after surgery, or as long as told by your health care provider. °· Do not drive or use heavy machinery while taking prescription pain medicine. °· Do not drive for 24 hours if you were given a medicine to help you relax (sedative). °· Return to your normal activities as told by your health care provider. Ask your health care provider what activities are safe for you. °Lifestyle ° °· Do not use any products that contain nicotine or tobacco, such as cigarettes and e-cigarettes. These can delay healing. If you need help quitting, ask your health care provider. °· Do not drink alcohol until your health care provider approves. °General instructions °· Do not douche, use tampons, or have sex for at least 6 weeks, or as told by your health care provider. °· Take over-the-counter and prescription medicines only as told by your health care provider. °· To monitor yourself for a fever, take your temperature at least once a day during recovery. °· If you struggle with physical or emotional changes after your procedure, speak with your health care provider or a therapist. °· To prevent or treat constipation while you are taking prescription pain medicine, your health care provider may recommend that you: °? Drink enough fluid to keep your urine clear or pale yellow. °? Take over-the-counter or prescription medicines. °? Eat foods that  are high in fiber, such as fresh fruits and vegetables, whole grains, and beans. °? Limit foods that are high in fat and processed sugars, such as fried and sweet foods. °· Keep all follow-up visits as told by your health care provider. This is important. °Contact a health care provider if: °· You have chills or a fever. °· You have redness, swelling, or pain around an incision. °· You have fluid or blood coming from an incision. °· Your incision feels warm to the touch. °· You have pus or a bad smell coming from an incision. °· An incision breaks open. °· You feel dizzy or light-headed. °· You have pain or bleeding when you urinate. °· You have diarrhea, nausea, or vomiting that does not go away. °· You have abnormal vaginal discharge. °· You have a rash. °· You have pain that does   not get better with medicine. °Get help right away if: °· You have a fever and your symptoms suddenly get worse. °· You have severe abdominal pain. °· You have chest pain. °· You have shortness of breath. °· You faint. °· You have pain, swelling, or redness on your leg. °· You have heavy vaginal bleeding with blood clots. °Summary °· After the procedure it is common to have abdominal pain. Your provider will give you medication for this. °· Do not take baths, swim, or use a hot tub until your health care provider approves. °· Do not lift anything that is heavier than 10 lbs (4.5 kg) for one month after surgery, or as long as told by your health care provider. °· Notify your provider if you have any signs or symptoms of infection after the procedure. °This information is not intended to replace advice given to you by your health care provider. Make sure you discuss any questions you have with your health care provider. °Document Revised: 12/05/2016 Document Reviewed: 03/05/2016 °Elsevier Patient Education © 2020 [employer] ° °

## 2019-02-17 NOTE — Progress Notes (Signed)
History:  49 y.o. G1P0010 here today for a consult for a RATH for pelvic pain. She was seen by Dr. Gala Romney and deemed a candidate for a hysterectomy and pt would like the least invasive procedure that get her back to full recover in the most timely fashion.    Pt was recently hospitalized for eval of pain uly 2020.  pt has fibroid tumors of the uterus; she reports heavy menses.  She is s/p RSO due to ruptured ectopic. She is requesting definitive management for the pain and bleeding.   The following portions of the patient's history were reviewed and updated as appropriate: allergies, current medications, past family history, past medical history, past social history, past surgical history and problem list.  Review of Systems:  Pertinent items are noted in HPI.    Objective:  Physical Exam Blood pressure 113/78, pulse 94, height 5\' 5"  (1.651 m), weight 140 lb (63.5 kg), last menstrual period 02/11/2019.  CONSTITUTIONAL: Well-developed, well-nourished female in no acute distress.  HENT:  Normocephalic, atraumatic EYES: Conjunctivae and EOM are normal. No scleral icterus.  NECK: Normal range of motion SKIN: Skin is warm and dry. No rash noted. Not diaphoretic.No pallor. [place]: Alert and oriented to person, place, and time. Normal coordination.  Abd: Soft, nontender and nondistended Pelvic: Normal appearing external genitalia; normal appearing vaginal mucosa and cervix.  Normal discharge.  Small mobile uterus, no other palpable masses, no uterine or adnexal tenderness  Labs and Imaging  07/25/2018 CLINICAL DATA:  Follow-up examination for left lower quadrant mass.  EXAM: TRANSVAGINAL ULTRASOUND OF PELVIS  DOPPLER ULTRASOUND OF OVARIES  TECHNIQUE: Transvaginal ultrasound examination of the pelvis was performed including evaluation of the uterus, ovaries, adnexal regions, and pelvic cul-de-sac.  Color and duplex Doppler ultrasound was utilized to evaluate blood flow to the  ovaries.  COMPARISON:  Prior CT from earlier the same day.  FINDINGS: Uterus  Measurements: 11.1 x 5.6 x 7.5 cm = volume: 240.0 mL. Complex well-circumscribed lesion measuring 6.8 x 5.0 x 6.0 cm at the posterior uterine body most consistent with an intramural fibroid, also seen on prior CT. Few additional previously seen fibroids not well visualized on this exam.  Endometrium  Thickness: 14.5 mm.  No focal abnormality visualized.  Right ovary  Measurements: 3.4 x 3.5 x 3.8 cm = volume: 23.2 mL. Normal appearance/no adnexal mass.  Left ovary  The native left ovary is not visualized on this exam. Specifically, previously seen left adnexal cystic lesion not visualized by transvaginal technique.  Pulsed Doppler evaluation demonstrates normal low-resistance arterial and venous waveforms in the right ovary. Native left ovary not visualized on this exam.  IMPRESSION: 1. Enlarged fibroid uterus. 2. Nonvisualization of the left ovary or the previously identified left adnexal cystic lesion, difficult to visualize due to the enlarged fibroid uterus. If further imaging is desired, a follow-up pelvic MRI, with and without contrast, could be performed for further evaluation. Alternatively, follow-up transabdominal ultrasound may provide some visualization of the left ovary given that it appears to be positioned somewhat superior and superficial within the left adnexa on prior CT. 3. Normal sonographic appearance of the right ovary. No evidence for right ovarian torsion.   07/25/2018 CLINICAL DATA:  Left pelvic pain sudden onset  EXAM: MRI PELVIS WITHOUT AND WITH CONTRAST  TECHNIQUE: Multiplanar multisequence MR imaging of the pelvis was performed both before and after administration of intravenous contrast.  CONTRAST:  6 cc Gadavist  COMPARISON:  Multiple exams, including pelvic ultrasound of July 25, 2018 and CT pelvis of July 25, 2018  FINDINGS: Urinary Tract:  Urinary bladder and distal ureters unremarkable.  Bowel:  Unremarkable  Vascular/Lymphatic: Unremarkable  Reproductive: The uterus measures 13.1 by 10.5 by 9.9 cm (volume = 710 cm^3). In addition to a 2.2 cm anterior uterine body fibroid there is a 9.2 by 6.7 by 8.8 cm (volume = 280 cm^3)posterior uterine body fibroid in the uterus. Endometrium unremarkable. Smaller fibroids noted along the fundus.  The right ovary measures 3.5 by 1.8 by 1.6 cm (volume = 5.3 cm^3)and contains a 1.5 by 1.2 cm cyst or follicle medially.  The left ovary has an enhancing parenchymal portion measuring 3.5 by 2.4 by 1.9 cm (volume = 8.4 cm^3) and cephalad and anterior to the ovary there is a curvilinear and cystic complex structure within upper cystic portion of 6.4 by 4.3 by 5.0 cm (volume = 72 cm^3) and adjacent curved/tubular portion with some associated complexity measuring about 4.1 by 2.3 by 2.9 cm (volume = 14 cm^3). On post-contrast images, the left ovarian vein is noted to extend to the enhancing tissue adjacent to the cornu of the uterus which is believed to represent ovary, rather than to the complex cystic lesion which is not demonstrating enhancement. Trace amount of adjacent free pelvic fluid.  Thickened junctional zone in the uterus, up to 2.5 cm, suspicious for adenomyosis.  Please note that the subtraction images demonstrate some sort of malfunction, they are not correctly subtracting the noncontrast images from the post-contrast series, because normal enhancing structures such as the uterus are not shown to enhance. The subtraction images should be disregarded.  Other:  Trace free pelvic fluid in the cul-de-sac.  Musculoskeletal: Unremarkable  IMPRESSION: 1. The left ovary appears to enhance which argues strongly against torsion. Vascularity and Doppler assessment of the left ovary, if warranted, would be best depicted by  transabdominal ultrasound, given that the ovary was not visible on transvaginal ultrasound. 2. Above the left ovary there is a complex tubular structure with a cystic component measuring at 72 cubic cm and a adjacent tubular portion measuring about 14 cubic cm. Based on morphology of this is probably a hydrosalpinx. Cystic ovarian neoplasm is considered less likely due to the tubular appearance of the mildly complex structures. I am skeptical of tubo-ovarian abscess given lack of surrounding inflammatory stranding adjacent to the cystic portion, although there is a trace amount of adjacent free pelvic fluid. 3. Uterine fibroids including a posterior uterine body 280 cubic cm fibroid. 4. Adenomyosis of the uterus.  02/07/2019 Diagnosis Endometrium, biopsy - ENDOMETRIOID-TYPE POLYP, BENIGN.  Assessment & Plan:  Pelvic pian >6 months and AUB. After review of options., pt elects for [facility] with bilateral salpingectomy. We reviewed the risks and benefits of an oophorectomy at her age. She has no increased risk factors for ovarian cancer.  I have reviewed with pt that if her pain is not uterine in nature, the hysterectomy with not help. She expressed understanding.     Patient desires surgical management with [place] with bilateral salpingectomy and [possible left ovarian cystectomy versus left oophorectomy.  The risks of surgery were discussed in detail with the patient including but not limited to: bleeding which may require transfusion or reoperation; infection which may require prolonged hospitalization or re-hospitalization and antibiotic therapy; injury to bowel, bladder, ureters and major vessels or other surrounding organs; need for additional procedures including laparotomy; thromboembolic phenomenon, incisional problems and other postoperative or anesthesia complications.  Patient was told that the likelihood  that her condition and symptoms will be treated effectively with this surgical management  was very high; the postoperative expectations were also discussed in detail. The patient also understands the alternative treatment options which were discussed in full. All questions were answered.  She was told that she will be contacted by our surgical scheduler regarding the time and date of her surgery; routine preoperative instructions of having nothing to eat or drink after midnight on the day prior to surgery and also coming to the hospital 1 1/2 hours prior to her time of surgery were also emphasized.  She was told she may be called for a preoperative appointment about a week prior to surgery and will be given further preoperative instructions at that visit. Printed patient education handouts about the procedure were given to the patient to review at home.  Total face-to-face time with patient was 30 min.  Greater than 50% was spent in counseling and coordination of care with the patient.   Shaliah Wann L. Harraway-Smith, M.D., [facility]nn June

## 2019-02-25 ENCOUNTER — Encounter: Payer: Self-pay | Admitting: Obstetrics & Gynecology

## 2019-03-16 NOTE — Progress Notes (Signed)
Error

## 2019-03-29 NOTE — Patient Instructions (Addendum)
DUE TO COVID-19 ONLY TWO VISITOR IS ALLOWED IN WAITING ROOM (VISITOR WILL HAVE A TEMPERATURE CHECK ON ARRIVAL AND MUST WEAR A FACE MASK THE ENTIRE TIME.)  ONCE YOU ARE ADMITTED TO YOUR PRIVATE ROOM, THE SAME ONE VISITOR IS ALLOWED TO VISIT DURING VISITING HOURS ONLY.  Your COVID swab testing is scheduled for:04/01/19 at 10:30 am , You must self quarantine after your testing per handout given to you at the testing site.  ([facility] up testing enter pre-surgical testing line)    Your procedure is scheduled on:04/05/19  Report to [facility] AT: 5:30  A. M.   Call this number if you have problems the morning of [phone].   OUR ADDRESS IS [address].  WE ARE LOCATED IN THE [place]                                   [facility].                                     REMEMBER:   DO NOT EAT FOOD OR DRINK LIQUIDS AFTER MIDNIGHT .    BRUSH YOUR TEETH THE MORNING OF SURGERY.  TAKE THESE MEDICATIONS MORNING OF SURGERY WITH A SIP OF WATER:  N/A  DO NOT WEAR JEWERLY, MAKE UP, OR NAIL POLISH.  DO NOT WEAR LOTIONS, POWDERS, PERFUMES/COLOGNE OR DEODORANT.  DO NOT SHAVE FOR 24 HOURS PRIOR TO DAY OF SURGERY.    CONTACTS, GLASSES, OR DENTURES MAY NOT BE WORN TO SURGERY.                                    [facility] IS NOT RESPONSIBLE  FOR ANY BELONGINGS.          BRING ALL PRESCRIPTION MEDICATIONS WITH YOU THE DAY OF SURGERY IN ORIGINAL CONTAINERS                                                               . - Preparing for Surgery Before surgery, you can play an important role.  Because skin is not sterile, your skin needs to be as free of germs as possible.  You can reduce the number of germs on your skin by washing with CHG (chlorahexidine gluconate) soap before surgery.  CHG is an antiseptic cleaner which kills germs and bonds with the skin to continue killing germs even after  washing. Please DO NOT use if you have an allergy to CHG or antibacterial soaps.  If your skin becomes reddened/irritated stop using the CHG and inform your nurse when you arrive at Short Stay. Do not shave (including legs and underarms) for at least 48 hours prior to the first CHG shower.  You may shave your face/neck. Please follow these instructions carefully:  1.  Shower with CHG Soap the night before surgery and the  morning of Surgery.  2.  If you choose to wash your hair, wash your hair first as usual with your  normal  shampoo.  3.  After you shampoo, rinse your hair and body thoroughly to remove the  shampoo.                           4.  Use CHG as you would any other liquid soap.  You can apply chg directly  to the skin and wash                       Gently with a scrungie or clean washcloth.  5.  Apply the CHG Soap to your body ONLY FROM THE NECK DOWN.   Do not use on face/ open                           Wound or open sores. Avoid contact with eyes, ears mouth and genitals (private parts).                       Wash face,  Genitals (private parts) with your normal soap.             6.  Wash thoroughly, paying special attention to the area where your surgery  will be performed.  7.  Thoroughly rinse your body with warm water from the neck down.  8.  DO NOT shower/wash with your normal soap after using and rinsing off  the CHG Soap.                9.  Pat yourself dry with a clean towel.            10.  Wear clean pajamas.            11.  Place clean sheets on your bed the night of your first shower and do not  sleep with pets. Day of Surgery : Do not apply any lotions/deodorants the morning of surgery.  Please wear clean clothes to the hospital/surgery center.  FAILURE TO FOLLOW THESE INSTRUCTIONS MAY RESULT IN THE CANCELLATION OF YOUR SURGERY PATIENT SIGNATURE_________________________________  NURSE  SIGNATURE__________________________________  ________________________________________________________________________

## 2019-03-30 ENCOUNTER — Encounter (HOSPITAL_COMMUNITY)
Admission: RE | Admit: 2019-03-30 | Discharge: 2019-03-30 | Disposition: A | Discharge status: Home / Self Care | Payer: 59 | Source: Ambulatory Visit | Attending: Obstetrics & Gynecology | Admitting: Obstetrics & Gynecology

## 2019-03-30 ENCOUNTER — Other Ambulatory Visit (HOSPITAL_COMMUNITY): Payer: 59

## 2019-03-30 ENCOUNTER — Encounter (HOSPITAL_COMMUNITY): Payer: Self-pay

## 2019-03-30 ENCOUNTER — Other Ambulatory Visit: Payer: Self-pay

## 2019-03-30 DIAGNOSIS — Z01812 Encounter for preprocedural laboratory examination: Secondary | ICD-10-CM | POA: Insufficient documentation

## 2019-03-30 HISTORY — DX: Anemia, unspecified: D64.9

## 2019-03-30 HISTORY — DX: Cardiac murmur, unspecified: R01.1

## 2019-03-30 LAB — CBC
HCT: 33.3 % — ABNORMAL LOW (ref 36.0–46.0)
Hemoglobin: 10 g/dL — ABNORMAL LOW (ref 12.0–15.0)
MCH: 25.3 pg — ABNORMAL LOW (ref 26.0–34.0)
MCHC: 30 g/dL (ref 30.0–36.0)
MCV: 84.1 fL (ref 80.0–100.0)
Platelets: 414 10*3/uL — ABNORMAL HIGH (ref 150–400)
RBC: 3.96 MIL/uL (ref 3.87–5.11)
RDW: 17 % — ABNORMAL HIGH (ref 11.5–15.5)
WBC: 5.2 10*3/uL (ref 4.0–10.5)
nRBC: 0 % (ref 0.0–0.2)

## 2019-03-30 LAB — ABO/RH: ABO/RH(D): A POS

## 2019-03-30 NOTE — Progress Notes (Signed)
PCP - [place] A.: PAC. LOV: 03/24/19 Cardiologist -   Chest x-ray -  EKG -  Stress Test -  ECHO -  Cardiac Cath -   Sleep Study -  CPAP -   Fasting Blood Sugar -  Checks Blood Sugar _____ times a day  Blood Thinner Instructions: Aspirin Instructions: Last Dose:  Anesthesia review:   Patient denies shortness of breath, fever, cough and chest pain at PAT appointment   Patient verbalized understanding of instructions that were given to them at the PAT appointment. Patient was also instructed that they will need to review over the PAT instructions again at home before surgery.

## 2019-04-01 ENCOUNTER — Inpatient Hospital Stay (HOSPITAL_COMMUNITY)
Admission: RE | Admit: 2019-04-01 | Discharge: 2019-04-01 | Disposition: A | Discharge status: Home / Self Care | Payer: 59 | Source: Ambulatory Visit

## 2019-04-01 NOTE — Progress Notes (Signed)
Pt tested positive in January of 2021. Pt states that she was tested at [facility] in [facility]. Pt will try to obtain the records for her upcoming surgery. If pt is not able to obtain records, a phone number was provided to her so that she can be rescheduled to have her covid test. Pt verbalizes understanding.   Jacqlyn Larsen, RN

## 2019-04-04 NOTE — Anesthesia Preprocedure Evaluation (Addendum)
Anesthesia Evaluation  Patient identified by MRN, date of birth, ID band Patient awake    Reviewed: Allergy & Precautions, NPO status , Patient's Chart, lab work & pertinent test results  History of Anesthesia Complications (+) PONV and history of anesthetic complications  Airway Mallampati: I  TM Distance: >3 FB Neck ROM: Full    Dental  (+) Teeth Intact   Pulmonary neg pulmonary ROS,    Pulmonary exam normal        Cardiovascular negative cardio ROS Normal cardiovascular exam     Neuro/Psych negative neurological ROS  negative psych ROS   GI/Hepatic negative GI ROS, Neg liver ROS,   Endo/Other  negative endocrine ROS  Renal/GU negative Renal ROS  negative genitourinary   Musculoskeletal negative musculoskeletal ROS (+)   Abdominal   Peds  Hematology  (+) anemia ,   Anesthesia Other Findings   Reproductive/Obstetrics                            Anesthesia Physical Anesthesia Plan  ASA: II  Anesthesia Plan: General   Post-op Pain Management:    Induction: Intravenous  PONV Risk Score and Plan: 4 or greater and Ondansetron, Dexamethasone, Treatment may vary due to age or medical condition, Midazolam and Scopolamine patch - Pre-op  Airway Management Planned: Oral ETT  Additional Equipment: None  Intra-op Plan:   Post-operative Plan: Extubation in OR  Informed Consent: I have reviewed the patients History and Physical, chart, labs and discussed the procedure including the risks, benefits and alternatives for the proposed anesthesia with the patient or authorized representative who has indicated his/her understanding and acceptance.     Dental advisory given  Plan Discussed with:   Anesthesia Plan Comments:        Anesthesia Quick Evaluation

## 2019-04-05 ENCOUNTER — Ambulatory Visit (HOSPITAL_BASED_OUTPATIENT_CLINIC_OR_DEPARTMENT_OTHER): Payer: 59 | Admitting: Anesthesiology

## 2019-04-05 ENCOUNTER — Ambulatory Visit (HOSPITAL_BASED_OUTPATIENT_CLINIC_OR_DEPARTMENT_OTHER)
Admission: RE | Admit: 2019-04-05 | Discharge: 2019-04-05 | Disposition: A | Discharge status: Home / Self Care | Payer: 59 | Attending: Obstetrics & Gynecology | Admitting: Obstetrics & Gynecology

## 2019-04-05 ENCOUNTER — Telehealth: Payer: Self-pay | Admitting: Obstetrics & Gynecology

## 2019-04-05 ENCOUNTER — Encounter (HOSPITAL_BASED_OUTPATIENT_CLINIC_OR_DEPARTMENT_OTHER): Payer: Self-pay | Admitting: Obstetrics & Gynecology

## 2019-04-05 ENCOUNTER — Encounter (HOSPITAL_BASED_OUTPATIENT_CLINIC_OR_DEPARTMENT_OTHER)
Admission: RE | Disposition: A | Discharge status: Home / Self Care | Payer: Self-pay | Source: Home / Self Care | Attending: Obstetrics & Gynecology

## 2019-04-05 ENCOUNTER — Other Ambulatory Visit: Payer: Self-pay

## 2019-04-05 DIAGNOSIS — Z79899 Other long term (current) drug therapy: Secondary | ICD-10-CM | POA: Insufficient documentation

## 2019-04-05 DIAGNOSIS — M419 Scoliosis, unspecified: Secondary | ICD-10-CM | POA: Insufficient documentation

## 2019-04-05 DIAGNOSIS — D500 Iron deficiency anemia secondary to blood loss (chronic): Secondary | ICD-10-CM | POA: Insufficient documentation

## 2019-04-05 DIAGNOSIS — G8929 Other chronic pain: Secondary | ICD-10-CM | POA: Insufficient documentation

## 2019-04-05 DIAGNOSIS — D259 Leiomyoma of uterus, unspecified: Secondary | ICD-10-CM | POA: Diagnosis present

## 2019-04-05 DIAGNOSIS — N800 Endometriosis of uterus: Secondary | ICD-10-CM | POA: Insufficient documentation

## 2019-04-05 DIAGNOSIS — N803 Endometriosis of pelvic peritoneum: Secondary | ICD-10-CM

## 2019-04-05 DIAGNOSIS — Z9889 Other specified postprocedural states: Secondary | ICD-10-CM

## 2019-04-05 DIAGNOSIS — R102 Pelvic and perineal pain: Secondary | ICD-10-CM | POA: Diagnosis not present

## 2019-04-05 DIAGNOSIS — N838 Other noninflammatory disorders of ovary, fallopian tube and broad ligament: Secondary | ICD-10-CM | POA: Insufficient documentation

## 2019-04-05 DIAGNOSIS — N83202 Unspecified ovarian cyst, left side: Secondary | ICD-10-CM

## 2019-04-05 DIAGNOSIS — D252 Subserosal leiomyoma of uterus: Secondary | ICD-10-CM | POA: Diagnosis not present

## 2019-04-05 DIAGNOSIS — D251 Intramural leiomyoma of uterus: Secondary | ICD-10-CM | POA: Diagnosis not present

## 2019-04-05 DIAGNOSIS — N939 Abnormal uterine and vaginal bleeding, unspecified: Secondary | ICD-10-CM | POA: Diagnosis not present

## 2019-04-05 DIAGNOSIS — N83291 Other ovarian cyst, right side: Secondary | ICD-10-CM

## 2019-04-05 HISTORY — PX: ROBOTIC ASSISTED TOTAL HYSTERECTOMY WITH BILATERAL SALPINGO OOPHERECTOMY: SHX6086

## 2019-04-05 LAB — TYPE AND SCREEN
ABO/RH(D): A POS
Antibody Screen: NEGATIVE

## 2019-04-05 LAB — POCT PREGNANCY, URINE: Preg Test, Ur: NEGATIVE

## 2019-04-05 SURGERY — HYSTERECTOMY, TOTAL, ROBOT-ASSISTED, LAPAROSCOPIC, WITH BILATERAL SALPINGO-OOPHORECTOMY
Anesthesia: General | Site: Abdomen | Laterality: Bilateral

## 2019-04-05 MED ORDER — ZOLPIDEM TARTRATE 5 MG PO TABS
5.0000 mg | ORAL_TABLET | Freq: Every evening | ORAL | Status: DC | PRN
  Filled 2019-04-05: qty 1

## 2019-04-05 MED ORDER — FENTANYL CITRATE (PF) 100 MCG/2ML IJ SOLN
INTRAMUSCULAR | Status: AC
  Filled 2019-04-05: qty 2

## 2019-04-05 MED ORDER — SCOPOLAMINE 1 MG/3DAYS TD PT72
MEDICATED_PATCH | TRANSDERMAL | Status: AC
  Filled 2019-04-05: qty 1

## 2019-04-05 MED ORDER — LACTATED RINGERS IV SOLN
INTRAVENOUS | Status: DC
  Filled 2019-04-05: qty 1000

## 2019-04-05 MED ORDER — DICLOFENAC POTASSIUM 50 MG PO TABS: 50.0000 mg | ORAL_TABLET | Freq: Three times a day (TID) | ORAL | 0 refills | Status: DC | PRN

## 2019-04-05 MED ORDER — OXYCODONE HCL 5 MG/5ML PO SOLN
5.0000 mg | Freq: Once | ORAL | Status: DC | PRN
  Filled 2019-04-05: qty 5

## 2019-04-05 MED ORDER — PROPOFOL 10 MG/ML IV BOLUS
INTRAVENOUS | Status: DC | PRN
  Administered 2019-04-05: 160 mg via INTRAVENOUS

## 2019-04-05 MED ORDER — LIDOCAINE 2% (20 MG/ML) 5 ML SYRINGE
INTRAMUSCULAR | Status: DC | PRN
  Administered 2019-04-05: 1.5 mg/kg/h via INTRAVENOUS

## 2019-04-05 MED ORDER — CEFAZOLIN SODIUM-DEXTROSE 2-4 GM/100ML-% IV SOLN
2.0000 g | INTRAVENOUS | Status: AC
  Administered 2019-04-05: 2 g via INTRAVENOUS
  Filled 2019-04-05: qty 100

## 2019-04-05 MED ORDER — ONDANSETRON HCL 4 MG/2ML IJ SOLN
INTRAMUSCULAR | Status: AC
  Filled 2019-04-05: qty 2

## 2019-04-05 MED ORDER — LIDOCAINE 2% (20 MG/ML) 5 ML SYRINGE
INTRAMUSCULAR | Status: AC
  Filled 2019-04-05: qty 10

## 2019-04-05 MED ORDER — OXYCODONE HCL 5 MG PO TABS
5.0000 mg | ORAL_TABLET | Freq: Once | ORAL | Status: DC | PRN
  Filled 2019-04-05: qty 1

## 2019-04-05 MED ORDER — IBUPROFEN 800 MG PO TABS
800.0000 mg | ORAL_TABLET | Freq: Four times a day (QID) | ORAL | Status: DC
  Filled 2019-04-05: qty 1

## 2019-04-05 MED ORDER — SODIUM CHLORIDE 0.9 % IR SOLN
Status: DC | PRN
  Administered 2019-04-05: 1000 mL
  Administered 2019-04-05: 1000 mL via INTRAVESICAL

## 2019-04-05 MED ORDER — BUPIVACAINE HCL (PF) 0.5 % IJ SOLN
INTRAMUSCULAR | Status: DC | PRN
  Administered 2019-04-05: 30 mL

## 2019-04-05 MED ORDER — OXYCODONE-ACETAMINOPHEN 5-325 MG PO TABS
ORAL_TABLET | ORAL | Status: AC
  Filled 2019-04-05: qty 1

## 2019-04-05 MED ORDER — HYDROCODONE-ACETAMINOPHEN 5-325 MG PO TABS: 1.0000 | ORAL_TABLET | Freq: Four times a day (QID) | ORAL | 0 refills | Status: DC | PRN

## 2019-04-05 MED ORDER — DEXAMETHASONE SODIUM PHOSPHATE 10 MG/ML IJ SOLN
INTRAMUSCULAR | Status: AC
  Filled 2019-04-05: qty 1

## 2019-04-05 MED ORDER — SOD CITRATE-CITRIC ACID 500-334 MG/5ML PO SOLN
30.0000 mL | ORAL | Status: DC
  Filled 2019-04-05: qty 30

## 2019-04-05 MED ORDER — PANTOPRAZOLE SODIUM 40 MG PO TBEC
40.0000 mg | DELAYED_RELEASE_TABLET | Freq: Every day | ORAL | Status: DC
  Filled 2019-04-05: qty 1

## 2019-04-05 MED ORDER — ACETAMINOPHEN 500 MG PO TABS
ORAL_TABLET | ORAL | Status: AC
  Filled 2019-04-05: qty 2

## 2019-04-05 MED ORDER — FENTANYL CITRATE (PF) 250 MCG/5ML IJ SOLN
INTRAMUSCULAR | Status: AC
  Filled 2019-04-05: qty 5

## 2019-04-05 MED ORDER — SIMETHICONE 80 MG PO CHEW
80.0000 mg | CHEWABLE_TABLET | Freq: Four times a day (QID) | ORAL | Status: DC | PRN
  Administered 2019-04-05 (×2): 80 mg via ORAL
  Filled 2019-04-05: qty 1

## 2019-04-05 MED ORDER — OXYCODONE-ACETAMINOPHEN 5-325 MG PO TABS
1.0000 | ORAL_TABLET | ORAL | Status: DC | PRN
  Administered 2019-04-05: 1 via ORAL
  Filled 2019-04-05: qty 2

## 2019-04-05 MED ORDER — ONDANSETRON HCL 4 MG/2ML IJ SOLN
INTRAMUSCULAR | Status: DC | PRN
  Administered 2019-04-05: 4 mg via INTRAVENOUS

## 2019-04-05 MED ORDER — ROCURONIUM BROMIDE 100 MG/10ML IV SOLN
INTRAVENOUS | Status: DC | PRN
  Administered 2019-04-05: 50 mg via INTRAVENOUS
  Administered 2019-04-05: 30 mg via INTRAVENOUS

## 2019-04-05 MED ORDER — SUGAMMADEX SODIUM 200 MG/2ML IV SOLN
INTRAVENOUS | Status: DC | PRN
  Administered 2019-04-05: 200 mg via INTRAVENOUS

## 2019-04-05 MED ORDER — GABAPENTIN 300 MG PO CAPS
ORAL_CAPSULE | ORAL | Status: AC
  Filled 2019-04-05: qty 1

## 2019-04-05 MED ORDER — KETOROLAC TROMETHAMINE 30 MG/ML IJ SOLN
INTRAMUSCULAR | Status: AC
  Filled 2019-04-05: qty 1

## 2019-04-05 MED ORDER — POLYETHYLENE GLYCOL 3350 17 G PO PACK
17.0000 g | PACK | Freq: Every day | ORAL | Status: DC | PRN
  Filled 2019-04-05: qty 1

## 2019-04-05 MED ORDER — CEFAZOLIN SODIUM-DEXTROSE 2-4 GM/100ML-% IV SOLN
INTRAVENOUS | Status: AC
  Filled 2019-04-05: qty 100

## 2019-04-05 MED ORDER — KETOROLAC TROMETHAMINE 30 MG/ML IJ SOLN
30.0000 mg | Freq: Once | INTRAMUSCULAR | Status: AC
  Administered 2019-04-05: 30 mg via INTRAVENOUS
  Filled 2019-04-05: qty 1

## 2019-04-05 MED ORDER — KETOROLAC TROMETHAMINE 30 MG/ML IJ SOLN
30.0000 mg | Freq: Four times a day (QID) | INTRAMUSCULAR | Status: DC
  Administered 2019-04-05: 30 mg via INTRAVENOUS
  Filled 2019-04-05: qty 1

## 2019-04-05 MED ORDER — SUGAMMADEX SODIUM 500 MG/5ML IV SOLN
INTRAVENOUS | Status: AC
  Filled 2019-04-05: qty 5

## 2019-04-05 MED ORDER — LIDOCAINE 2% (20 MG/ML) 5 ML SYRINGE
INTRAMUSCULAR | Status: AC
  Filled 2019-04-05: qty 5

## 2019-04-05 MED ORDER — EPHEDRINE SULFATE 50 MG/ML IJ SOLN
INTRAMUSCULAR | Status: DC | PRN
  Administered 2019-04-05: 7 mg via INTRAVENOUS

## 2019-04-05 MED ORDER — DEXAMETHASONE SODIUM PHOSPHATE 10 MG/ML IJ SOLN
INTRAMUSCULAR | Status: DC | PRN
  Administered 2019-04-05: 8 mg via INTRAVENOUS

## 2019-04-05 MED ORDER — SIMETHICONE 80 MG PO CHEW
CHEWABLE_TABLET | ORAL | Status: AC
  Filled 2019-04-05: qty 1

## 2019-04-05 MED ORDER — LIDOCAINE HCL 2 % IJ SOLN
INTRAMUSCULAR | Status: AC
  Filled 2019-04-05: qty 20

## 2019-04-05 MED ORDER — MIDAZOLAM HCL 2 MG/2ML IJ SOLN
INTRAMUSCULAR | Status: AC
  Filled 2019-04-05: qty 2

## 2019-04-05 MED ORDER — MIDAZOLAM HCL 5 MG/5ML IJ SOLN
INTRAMUSCULAR | Status: DC | PRN
  Administered 2019-04-05: 2 mg via INTRAVENOUS

## 2019-04-05 MED ORDER — PROPOFOL 10 MG/ML IV BOLUS
INTRAVENOUS | Status: AC
  Filled 2019-04-05: qty 20

## 2019-04-05 MED ORDER — SCOPOLAMINE 1 MG/3DAYS TD PT72
MEDICATED_PATCH | TRANSDERMAL | Status: DC | PRN
  Administered 2019-04-05: 1 via TRANSDERMAL

## 2019-04-05 MED ORDER — DEXTROSE-NACL 5-0.45 % IV SOLN
INTRAVENOUS | Status: AC
  Filled 2019-04-05: qty 1000

## 2019-04-05 MED ORDER — FENTANYL CITRATE (PF) 100 MCG/2ML IJ SOLN
25.0000 ug | INTRAMUSCULAR | Status: DC | PRN
  Filled 2019-04-05: qty 1

## 2019-04-05 MED ORDER — ROCURONIUM BROMIDE 10 MG/ML (PF) SYRINGE
PREFILLED_SYRINGE | INTRAVENOUS | Status: AC
  Filled 2019-04-05: qty 10

## 2019-04-05 MED ORDER — PROMETHAZINE HCL 25 MG/ML IJ SOLN
6.2500 mg | INTRAMUSCULAR | Status: DC | PRN
  Administered 2019-04-05: 6.25 mg via INTRAVENOUS
  Filled 2019-04-05: qty 1

## 2019-04-05 MED ORDER — HYDROMORPHONE HCL 1 MG/ML IJ SOLN
0.2000 mg | INTRAMUSCULAR | Status: DC | PRN
  Filled 2019-04-05: qty 1

## 2019-04-05 MED ORDER — FENTANYL CITRATE (PF) 100 MCG/2ML IJ SOLN
INTRAMUSCULAR | Status: DC | PRN
  Administered 2019-04-05: 50 ug via INTRAVENOUS
  Administered 2019-04-05: 25 ug via INTRAVENOUS
  Administered 2019-04-05: 50 ug via INTRAVENOUS
  Administered 2019-04-05: 25 ug via INTRAVENOUS
  Administered 2019-04-05 (×4): 50 ug via INTRAVENOUS

## 2019-04-05 MED ORDER — GABAPENTIN 300 MG PO CAPS
300.0000 mg | ORAL_CAPSULE | ORAL | Status: AC
  Administered 2019-04-05: 300 mg via ORAL
  Filled 2019-04-05: qty 1

## 2019-04-05 MED ORDER — TRAMADOL HCL 50 MG PO TABS
50.0000 mg | ORAL_TABLET | Freq: Four times a day (QID) | ORAL | Status: DC | PRN
  Filled 2019-04-05: qty 1

## 2019-04-05 MED ORDER — KETAMINE HCL 10 MG/ML IJ SOLN
INTRAMUSCULAR | Status: DC | PRN
  Administered 2019-04-05: 30 mg via INTRAVENOUS

## 2019-04-05 MED ORDER — MENTHOL 3 MG MT LOZG
1.0000 | LOZENGE | OROMUCOSAL | Status: DC | PRN
  Filled 2019-04-05: qty 9

## 2019-04-05 MED ORDER — BISACODYL 10 MG RE SUPP
10.0000 mg | Freq: Every day | RECTAL | Status: DC | PRN
  Filled 2019-04-05: qty 1

## 2019-04-05 MED ORDER — FLUORESCEIN SODIUM 10 % IV SOLN
INTRAVENOUS | Status: DC | PRN
  Administered 2019-04-05: 5 mL via INTRAVENOUS

## 2019-04-05 MED ORDER — ONDANSETRON HCL 4 MG/2ML IJ SOLN
4.0000 mg | Freq: Four times a day (QID) | INTRAMUSCULAR | Status: DC | PRN
  Administered 2019-04-05: 4 mg via INTRAVENOUS
  Filled 2019-04-05: qty 2

## 2019-04-05 MED ORDER — ACETAMINOPHEN 500 MG PO TABS
1000.0000 mg | ORAL_TABLET | ORAL | Status: AC
  Administered 2019-04-05: 1000 mg via ORAL
  Filled 2019-04-05: qty 2

## 2019-04-05 MED ORDER — FLUORESCEIN SODIUM 10 % IV SOLN
INTRAVENOUS | Status: AC
  Filled 2019-04-05: qty 5

## 2019-04-05 MED ORDER — PROMETHAZINE HCL 25 MG/ML IJ SOLN
INTRAMUSCULAR | Status: AC
  Filled 2019-04-05: qty 1

## 2019-04-05 MED ORDER — LIDOCAINE HCL (CARDIAC) PF 100 MG/5ML IV SOSY
PREFILLED_SYRINGE | INTRAVENOUS | Status: DC | PRN
  Administered 2019-04-05: 50 mg via INTRAVENOUS

## 2019-04-05 MED ORDER — KETAMINE HCL 10 MG/ML IJ SOLN
INTRAMUSCULAR | Status: AC
  Filled 2019-04-05: qty 1

## 2019-04-05 MED ORDER — ONDANSETRON HCL 4 MG PO TABS
4.0000 mg | ORAL_TABLET | Freq: Four times a day (QID) | ORAL | Status: DC | PRN
  Filled 2019-04-05: qty 1

## 2019-04-05 MED ORDER — DOCUSATE SODIUM 100 MG PO CAPS
100.0000 mg | ORAL_CAPSULE | Freq: Two times a day (BID) | ORAL | Status: DC
  Filled 2019-04-05: qty 1

## 2019-04-05 SURGICAL SUPPLY — 69 items
APPLICATOR ARISTA FLEXITIP XL (MISCELLANEOUS) IMPLANT
APPLIER CLIP 5 13 M/L LIGAMAX5 (MISCELLANEOUS) ×2
BARRIER ADHS 3X4 INTERCEED (GAUZE/BANDAGES/DRESSINGS) IMPLANT
BLADE SURG 10 STRL SS (BLADE) ×2 IMPLANT
CANISTER SUCT 3000ML PPV (MISCELLANEOUS) ×2 IMPLANT
CATH FOLEY 3WAY  5CC 16FR (CATHETERS) ×1
CATH FOLEY 3WAY 5CC 16FR (CATHETERS) ×1 IMPLANT
CLIP APPLIE 5 13 M/L LIGAMAX5 (MISCELLANEOUS) ×1 IMPLANT
COVER BACK TABLE 60X90IN (DRAPES) ×2 IMPLANT
COVER TIP SHEARS 8 DVNC (MISCELLANEOUS) ×1 IMPLANT
COVER TIP SHEARS 8MM DA VINCI (MISCELLANEOUS) ×1
DECANTER SPIKE VIAL GLASS SM (MISCELLANEOUS) ×2 IMPLANT
DEFOGGER SCOPE WARMER CLEARIFY (MISCELLANEOUS) ×2 IMPLANT
DERMABOND ADVANCED (GAUZE/BANDAGES/DRESSINGS) ×1
DERMABOND ADVANCED .7 DNX12 (GAUZE/BANDAGES/DRESSINGS) ×1 IMPLANT
DRAPE ARM DVNC X/XI (DISPOSABLE) ×3 IMPLANT
DRAPE COLUMN DVNC XI (DISPOSABLE) ×1 IMPLANT
DRAPE DA VINCI XI ARM (DISPOSABLE) ×3
DRAPE DA VINCI XI COLUMN (DISPOSABLE) ×1
DURAPREP 26ML APPLICATOR (WOUND CARE) ×2 IMPLANT
ELECT REM PT RETURN 9FT ADLT (ELECTROSURGICAL) ×2
ELECTRODE REM PT RTRN 9FT ADLT (ELECTROSURGICAL) ×1 IMPLANT
GLOVE BIO SURGEON STRL SZ 6.5 (GLOVE) ×6 IMPLANT
GLOVE BIOGEL PI IND STRL 7.0 (GLOVE) ×3 IMPLANT
GLOVE BIOGEL PI INDICATOR 7.0 (GLOVE) ×3
GYRUS RUMI II 2.5CM BLUE (DISPOSABLE)
GYRUS RUMI II 3.5CM BLUE (DISPOSABLE)
GYRUS RUMI II 4.0CM BLUE (DISPOSABLE)
HEMOSTAT ARISTA ABSORB 3G PWDR (HEMOSTASIS) IMPLANT
IRRIG SUCT STRYKERFLOW 2 WTIP (MISCELLANEOUS) ×4
IRRIGATION SUCT STRKRFLW 2 WTP (MISCELLANEOUS) ×2 IMPLANT
LEGGING LITHOTOMY PAIR STRL (DRAPES) ×2 IMPLANT
NEEDLE INSUFFLATION 120MM (ENDOMECHANICALS) ×2 IMPLANT
OBTURATOR OPTICAL STANDARD 8MM (TROCAR) ×1
OBTURATOR OPTICAL STND 8 DVNC (TROCAR) ×1
OBTURATOR OPTICALSTD 8 DVNC (TROCAR) ×1 IMPLANT
OCCLUDER COLPOPNEUMO (BALLOONS) ×2 IMPLANT
PACK ROBOT WH (CUSTOM PROCEDURE TRAY) ×2 IMPLANT
PACK ROBOTIC GOWN (GOWN DISPOSABLE) ×2 IMPLANT
PACK TRENDGUARD 450 HYBRID PRO (MISCELLANEOUS) ×1 IMPLANT
PAD PREP 24X48 CUFFED NSTRL (MISCELLANEOUS) ×2 IMPLANT
PROTECTOR NERVE ULNAR (MISCELLANEOUS) ×4 IMPLANT
RUMI II 3.0CM BLUE KOH-EFFICIE (DISPOSABLE) IMPLANT
RUMI II GYRUS 2.5CM BLUE (DISPOSABLE) IMPLANT
RUMI II GYRUS 3.5CM BLUE (DISPOSABLE) IMPLANT
RUMI II GYRUS 4.0CM BLUE (DISPOSABLE) IMPLANT
SEAL CANN UNIV 5-8 DVNC XI (MISCELLANEOUS) ×3 IMPLANT
SEAL XI 5MM-8MM UNIVERSAL (MISCELLANEOUS) ×3
SEALER VESSEL DA VINCI XI (MISCELLANEOUS) ×1
SEALER VESSEL EXT DVNC XI (MISCELLANEOUS) ×1 IMPLANT
SET IRRIG Y TYPE TUR BLADDER L (SET/KITS/TRAYS/PACK) ×2 IMPLANT
SET TRI-LUMEN FLTR TB AIRSEAL (TUBING) ×2 IMPLANT
SUT VIC AB 0 CT1 27 (SUTURE) ×2
SUT VIC AB 0 CT1 27XBRD ANBCTR (SUTURE) ×2 IMPLANT
SUT VICRYL 0 UR6 27IN ABS (SUTURE) IMPLANT
SUT VICRYL 4-0 PS2 18IN ABS (SUTURE) ×4 IMPLANT
SUT VLOC 180 0 9IN  GS21 (SUTURE) ×2
SUT VLOC 180 0 9IN GS21 (SUTURE) ×2 IMPLANT
SYSTEM CARTER THOMASON II (TROCAR) IMPLANT
TIP RUMI ORANGE 6.7MMX12CM (TIP) IMPLANT
TIP UTERINE 5.1X6CM LAV DISP (MISCELLANEOUS) IMPLANT
TIP UTERINE 6.7X10CM GRN DISP (MISCELLANEOUS) ×2 IMPLANT
TIP UTERINE 6.7X6CM WHT DISP (MISCELLANEOUS) IMPLANT
TIP UTERINE 6.7X8CM BLUE DISP (MISCELLANEOUS) IMPLANT
TOWEL OR 17X26 10 PK STRL BLUE (TOWEL DISPOSABLE) ×2 IMPLANT
TRENDGUARD 450 HYBRID PRO PACK (MISCELLANEOUS) ×2
TROCAR BLADELESS OPT 5 100 (ENDOMECHANICALS) ×2 IMPLANT
TROCAR PORT AIRSEAL 5X120 (TROCAR) IMPLANT
WATER STERILE IRR 1000ML POUR (IV SOLUTION) ×2 IMPLANT

## 2019-04-05 NOTE — Op Note (Signed)
04/05/2019  10:23 AM  PATIENT:  Ashley Shah  49 y.o. female  PRE-OPERATIVE DIAGNOSIS:  Fibroids, AUB, pelvic pain   POST-OPERATIVE DIAGNOSIS:  Fibroids, AUB, pelvic pain, endometriosis   PROCEDURE:  Procedure(s): XI ROBOTIC ASSISTED TOTAL HYSTERECTOMY WITH BILATERAL SALPINGECTOMY AND CYSTOSCOPY (Bilateral)  SURGEON:  Surgeon(s) and Role:    * Lavonia Drafts, MD - Primary    * Donnamae Jude, MD - Assisting  ANESTHESIA:   general  EBL:  60 mL   BLOOD ADMINISTERED:none  DRAINS: none   LOCAL MEDICATIONS USED:  MARCAINE     SPECIMEN:  Source of Specimen:  uterus, cervix and fallopian tubes    DISPOSITION OF SPECIMEN:  PATHOLOGY  COUNTS:  YES  TOURNIQUET:  * No tourniquets in log *  DICTATION: .Note written in EPIC  PLAN OF CARE: prolonged observation with discharge later today    PATIENT DISPOSITION:  PACU - hemodynamically stable.   Delay start of Pharmacological VTE agent (>24hrs) due to surgical blood loss or risk of bleeding: not applicable  Complications: none immediate  The risks, benefits, and alternatives of surgery were explained, understood, and accepted. Consents were signed. All questions were answered.   Findings: Normal ovaries bilaterally with no cysys noted. Left paraovarian cyst. Uterine fibroids. Endometriosis noted in the cul-de-sac and on the right adnexa. The fallopian tubes were previously ligated   The patient was taken to the operating room and general anesthesia was applied without complication. She was placed in the dorsal lithotomy position and her abdomen and vagina were prepped and draped after she had been carefully positioned on the table. A bimanual exam revealed a 14 week size uterus that was mobile. Her adnexa were not enlarged. The cervix was measured and the uterus was sounded to 10 cm. A Rumi uterine manipulator was placed without difficulty. A Foley catheter was placed and it drained clear throughout the case. Gloves were  changed and attention was turned to the abdomen. A 26mm incision was made in the left upper quadrant and the laparoscope was inserted through direct visualized using the Optiview trocar.  CO2 was used to insufflate the abdomen to approximately 4 L. After good pneumoperitoneum was established, a 8 mm trocar was placed above the umbilicus.  Laparoscopy confirmed correct placement. She was placed in Trendelenburg position and ports were placed in appropriate positions on her abdomen to allow maximum exposure during the robotic case. Specifically there were two 78mm assistant ports placed in the lower quadrants bilaterally under direct laproscopic visualization.   These were all placed under direct laparoscopic visualization. The robot was docked and I proceeded with a robotic portion of the case.  The pelvis was inspected and the uterus was found to have fibroids and be enlarged.  The ureters and the infundibulopelvic ligaments were identified. I excised the fallopian tube segments bilaterally. The round ligament on each side was cauterized and cut. The Vessel Sealer was used for this portion. The round ligaments were identified, cauterized and ligated, a bladder flap was created anteriorly. The uterine vessels were identified and cauterized and then cut.The bladder was pushed out of the operative site and an anterior colpotomy was made. The colpotomy incision was extended circumferentially, following the blue outline of the Rumi manipulator. All pedicles were hemostatic.  The uterus was removed from the vagina with the fallopian tube segments. The vaginal cuff was closed with v-lock suture.  Excellent hemostasis was noted throughout. The pelvis was irrigated. The intraabdominal pressure was lowered assess hemostasis. After  determining excellent hemostasis, the robot was undocked. At this point I performed cystoscopy. The cystoscopy revealed blue ejection from both ureters.    The skin from all of the other ports was  closed with 3-0 vicryl. 30cc of 0.5% Marcaine was injected into the port sites.  The patient was then extubated and taken to recovery in stable condition.   An experienced assistant was required given the standard of surgical care given the complexity of the case.  This assistant was needed for exposure, dissection, suctioning, retraction, instrument exchange, assisting with delivery with administration of fundal pressure, and for overall help during the procedure.  Sponge, lap and needle counts were correct x 2.  Mari Battaglia L. Harraway-Smith, M.D., [facility]

## 2019-04-05 NOTE — Discharge Instructions (Signed)

## 2019-04-05 NOTE — Progress Notes (Signed)
04/05/2019 1730 Dr. Ihor Dow given phone update regarding pt. Progress. Pt. Ambulating, eating, able to void and surgical pain tolerated with prescribed interventions. Pt. States majority of her discomfort is from her back, neck and shoulders and states she has moderate to severe scoliosis which may be attributed to this pain. MD made aware. MD states pt. Is ok to d/c home at this time and MD will personally call pt. In the am to follow up again on her progress. Pt. And husband updated on plan of care and voiced understanding. Pt. D/c home per orders.  Rael Yo, Arville Lime

## 2019-04-05 NOTE — H&P (Addendum)
Preoperative History and Physical  Ashley Shah is a 49 y.o. G1P0010 here for surgical management of chronic pelvic pain, ovarian cyst and uterine fibroids. Left ovarina cyst seen on Renal CT but, not on f/u US  Proposed surgery: Robot assisted total laparoscopic hysterectomy with bilateral salpingectomy and resection of ovarian cyst and possible oophorectomy.   Past Medical History:  Diagnosis Date  . Anemia   . Heart murmur    Past Surgical History:  Procedure Laterality Date  . TUBAL LIGATION     OB History    Gravida  1   Para      Term      Preterm      AB  1   Living  0     SAB  1   TAB      Ectopic      Multiple      Live Births             Patient denies any cervical dysplasia or STIs. Medications Prior to Admission  Medication Sig Dispense Refill Last Dose  . zolpidem (AMBIEN) 10 MG tablet Take 10 mg by mouth at bedtime as needed for sleep.   04/04/2019 at Unknown time  . ondansetron (ZOFRAN ODT) 4 MG disintegrating tablet Take 1 tablet (4 mg total) by mouth every 8 (eight) hours as needed for nausea or vomiting. (Patient not taking: Reported on 02/07/2019) 20 tablet 0   . oxyCODONE (ROXICODONE) 5 MG immediate release tablet Take 1 tablet (5 mg total) by mouth every 4 (four) hours as needed for severe pain. (Patient not taking: Reported on 02/07/2019) 12 tablet 0     Allergies  Allergen Reactions  . Sulfa Antibiotics Itching  . Sulfur Itching   Social History:   reports that she has never smoked. She has never used smokeless tobacco. She reports current alcohol use. She reports that she does not use drugs. Family History  Problem Relation Age of Onset  . Brain cancer Paternal Grandfather   . Cancer Maternal Grandfather     Review of Systems: Noncontributory  PHYSICAL EXAM: Blood pressure 107/72, temperature 97.6 F (36.4 C), resp. rate 14, height 5\' 5"  (1.651 m), last menstrual period 03/23/2019, SpO2 99 %. General appearance - alert, well  appearing, and in no distress Chest - clear to auscultation, no wheezes, rales or rhonchi, symmetric air entry Heart - normal rate and regular rhythm Abdomen - soft, nontender, nondistended, no masses or organomegaly Pelvic - examination not indicated Extremities - peripheral pulses normal, no pedal edema, no clubbing or cyanosis  Labs: Results for orders placed or performed during the hospital encounter of 04/05/19 (from the past 336 hour(s))  Pregnancy, urine POC   Collection Time: 04/05/19  5:53 AM  Result Value Ref Range   Preg Test, Ur NEGATIVE NEGATIVE  Results for orders placed or performed during the hospital encounter of 03/30/19 (from the past 336 hour(s))  CBC   Collection Time: 03/30/19  2:38 PM  Result Value Ref Range   WBC 5.2 4.0 - 10.5 K/uL   RBC 3.96 3.87 - 5.11 MIL/uL   Hemoglobin 10.0 (L) 12.0 - 15.0 g/dL   HCT 33.3 (L) 36.0 - 46.0 %   MCV 84.1 80.0 - 100.0 fL   MCH 25.3 (L) 26.0 - 34.0 pg   MCHC 30.0 30.0 - 36.0 g/dL   RDW 17.0 (H) 11.5 - 15.5 %   Platelets 414 (H) 150 - 400 K/uL   nRBC 0.0 0.0 -  0.2 %  Type and screen [place]   Collection Time: 03/30/19  2:38 PM  Result Value Ref Range   ABO/RH(D) A POS    Antibody Screen NEG    Sample Expiration 04/13/2019,2359    Extend sample reason      NO TRANSFUSIONS OR PREGNANCY IN THE PAST 3 MONTHS Performed at [facility] [place] [address], [place], [place] [address]   ABO/Rh   Collection Time: 03/30/19  2:38 PM  Result Value Ref Range   ABO/RH(D)      A POS Performed at [facility], [place] [address], [place], [place] [address]     Imaging Studies: 07/2018 CLINICAL DATA:  Follow-up examination for left lower quadrant mass.  EXAM: TRANSVAGINAL ULTRASOUND OF PELVIS  DOPPLER ULTRASOUND OF OVARIES  TECHNIQUE: Transvaginal ultrasound examination of the pelvis was performed including evaluation of the uterus, ovaries, adnexal regions, and pelvic  cul-de-sac.  Color and duplex Doppler ultrasound was utilized to evaluate blood flow to the ovaries.  COMPARISON:  Prior CT from earlier the same day.  FINDINGS: Uterus  Measurements: 11.1 x 5.6 x 7.5 cm = volume: 240.0 mL. Complex well-circumscribed lesion measuring 6.8 x 5.0 x 6.0 cm at the posterior uterine body most consistent with an intramural fibroid, also seen on prior CT. Few additional previously seen fibroids not well visualized on this exam.  Endometrium  Thickness: 14.5 mm.  No focal abnormality visualized.  Right ovary  Measurements: 3.4 x 3.5 x 3.8 cm = volume: 23.2 mL. Normal appearance/no adnexal mass.  Left ovary  The native left ovary is not visualized on this exam. Specifically, previously seen left adnexal cystic lesion not visualized by transvaginal technique.  Pulsed Doppler evaluation demonstrates normal low-resistance arterial and venous waveforms in the right ovary. Native left ovary not visualized on this exam.  IMPRESSION: 1. Enlarged fibroid uterus. 2. Nonvisualization of the left ovary or the previously identified left adnexal cystic lesion, difficult to visualize due to the enlarged fibroid uterus. If further imaging is desired, a follow-up pelvic MRI, with and without contrast, could be performed for further evaluation. Alternatively, follow-up transabdominal ultrasound may provide some visualization of the left ovary given that it appears to be positioned somewhat superior and superficial within the left adnexa on prior CT. 3. Normal sonographic appearance of the right ovary. No evidence for right ovarian torsion.  Assessment: Patient Active Problem List   Diagnosis Date Noted  . Chronic pelvic pain in female 04/05/2019  . Fibroid uterus 02/07/2019  . Iron deficiency anemia due to chronic blood loss 04/18/2016  . Irregular menses 04/18/2016    Plan: Patient will undergo surgical management with Robot assisted  total laparoscopic hysterectomy with bilateral salpingectomy and resection of possible left ovarian cyst and possible oophorectomy. The risks of surgery were discussed in detail with the patient including but not limited to: bleeding which may require transfusion or reoperation; infection which may require antibiotics; injury to surrounding organs which may involve bowel, bladder, ureters ; need for additional procedures including laparoscopy or laparotomy; thromboembolic phenomenon, surgical site problems and other postoperative/anesthesia complications. Likelihood of success in alleviating the patient's condition was discussed. Routine postoperative instructions will be reviewed with the patient and her family in detail after surgery.  The patient concurred with the proposed plan, giving informed written consent for the surgery.  Patient has been NPO since last night she will remain NPO for procedure.  Anesthesia and OR aware.  Preoperative prophylactic antibiotics and  SCDs ordered on call to the OR.  To OR when ready.  Veyda Kaufman L. Ihor Dow, M.D., [facility] 04/05/2019 7:26 AM

## 2019-04-05 NOTE — Telephone Encounter (Signed)
TC to spouse post op.   Ashley Shah, M.D., Ashley Shah

## 2019-04-05 NOTE — Anesthesia Postprocedure Evaluation (Signed)
Anesthesia Post Note  Patient: Rayona J Vandergriff  Procedure(s) Performed: XI ROBOTIC ASSISTED TOTAL HYSTERECTOMY WITH BILATERAL SALPINGECTOMY AND CYSTOSCOPY (Bilateral Abdomen)     Patient location during evaluation: PACU Anesthesia Type: General Level of consciousness: awake and alert Pain management: pain level controlled Vital Signs Assessment: post-procedure vital signs reviewed and stable Respiratory status: spontaneous breathing, nonlabored ventilation and respiratory function stable Cardiovascular status: blood pressure returned to baseline and stable Postop Assessment: no apparent nausea or vomiting Anesthetic complications: no    Last Vitals:  Vitals:   04/05/19 1157 04/05/19 1257  BP: 113/82 116/79  Pulse: 76 80  Resp: 12 14  Temp:  (!) 36.2 C  SpO2: 96% 100%    Last Pain:  Vitals:   04/05/19 1257  PainSc: 1                  Lidia Collum

## 2019-04-05 NOTE — Anesthesia Procedure Notes (Signed)
Procedure Name: Intubation Date/Time: 04/05/2019 7:46 AM Performed by: Garrel Ridgel, CRNA Pre-anesthesia Checklist: Patient identified, Emergency Drugs available, Suction available and Patient being monitored Patient Re-evaluated:Patient Re-evaluated prior to induction Oxygen Delivery Method: Circle system utilized Preoxygenation: Pre-oxygenation with 100% oxygen Induction Type: IV induction Ventilation: Mask ventilation without difficulty Laryngoscope Size: Mac and 3 Grade View: Grade II Tube type: Oral Tube size: 7.0 mm Number of attempts: 1 Airway Equipment and Method: Stylet and Oral airway Placement Confirmation: ETT inserted through vocal cords under direct vision,  positive ETCO2 and breath sounds checked- equal and bilateral Tube secured with: Tape Dental Injury: Teeth and Oropharynx as per pre-operative assessment

## 2019-04-05 NOTE — Brief Op Note (Signed)
04/05/2019  10:23 AM  PATIENT:  Ashley Shah  49 y.o. female  PRE-OPERATIVE DIAGNOSIS:  Fibroids  POST-OPERATIVE DIAGNOSIS:  Fibroids  PROCEDURE:  Procedure(s): XI ROBOTIC ASSISTED TOTAL HYSTERECTOMY WITH BILATERAL SALPINGECTOMY AND CYSTOSCOPY (Bilateral)  SURGEON:  Surgeon(s) and Role:    * Lavonia Drafts, MD - Primary    * Donnamae Jude, MD - Assisting  ANESTHESIA:   general  EBL:  60 mL   BLOOD ADMINISTERED:none  DRAINS: none   LOCAL MEDICATIONS USED:  MARCAINE     SPECIMEN:  Source of Specimen:  uterus, cervix and fallopian tubes    DISPOSITION OF SPECIMEN:  PATHOLOGY  COUNTS:  YES  TOURNIQUET:  * No tourniquets in log *  DICTATION: .Note written in EPIC  PLAN OF CARE: prolonged observation with discharge later today    PATIENT DISPOSITION:  PACU - hemodynamically stable.   Delay start of Pharmacological VTE agent (>24hrs) due to surgical blood loss or risk of bleeding: not applicable  Complications: none immediate  Ashley Shah, M.D., [facility] June

## 2019-04-05 NOTE — Transfer of Care (Signed)
Immediate Anesthesia Transfer of Care Note  Patient: Ashley Shah  Procedure(s) Performed: XI ROBOTIC ASSISTED TOTAL HYSTERECTOMY WITH BILATERAL SALPINGECTOMY AND CYSTOSCOPY (Bilateral Abdomen)  Patient Location: PACU  Anesthesia Type:General  Level of Consciousness: awake, alert , oriented and drowsy  Airway & Oxygen Therapy: Patient Spontanous Breathing and Patient connected to face mask oxygen  Post-op Assessment: Report given to RN and Post -op Vital signs reviewed and stable  Post vital signs: Reviewed and stable  Last Vitals:  Vitals Value Taken Time  BP 113/88 04/05/19 1030  Temp 36.3 C 04/05/19 1024  Pulse 85 04/05/19 1032  Resp 14 04/05/19 1032  SpO2 100 % 04/05/19 1032  Vitals shown include unvalidated device data.  Last Pain:  Vitals:   04/05/19 1024  PainSc: (P) 0-No pain      Patients Stated Pain Goal: 4 ([id number])  Complications: No apparent anesthesia complications

## 2019-04-06 ENCOUNTER — Telehealth: Payer: Self-pay | Admitting: Obstetrics & Gynecology

## 2019-04-06 ENCOUNTER — Telehealth: Payer: Self-pay

## 2019-04-06 ENCOUNTER — Encounter: Payer: Self-pay | Admitting: Obstetrics & Gynecology

## 2019-04-06 LAB — SURGICAL PATHOLOGY

## 2019-04-06 NOTE — Telephone Encounter (Signed)
TC to pt. Answering machine. Left message to call back.   Ashley Shah, M.D., Ashley Shah

## 2019-04-06 NOTE — Discharge Summary (Signed)
Physician Discharge Summary  Patient ID: Ashley Shah MRN: [id number] DOB/AGE: 1970/05/26 49 y.o.  Admit date: 04/05/2019 Discharge date: 04/06/2019  Admission Diagnoses: fibroids; ovarian cyst.   Discharge Diagnoses:  Principal Problem:   Chronic pelvic pain in female Active Problems:   Iron deficiency anemia due to chronic blood loss   Fibroid uterus   Left ovarian cyst   Post-operative state endometriosis  Discharged Condition: good  Hospital Course: Patient had an uncomplicated surgery; for further details of this surgery, please refer to the operative note. Furthermore, the patient had an uncomplicated postoperative course.  By time of discharge, her pain was controlled on oral pain medications; she was ambulating, voiding without difficulty, tolerating regular diet and passing flatus.  She was deemed stable for discharge to home.  Pt reported 'pain from her scoliosis' at the time of discharge but, noted on surgical issues.    Treatments: surgery: Robot assisted total laparoscopic hysterectomy with bilateral salpingectomy    Discharge Exam: Blood pressure 112/74, pulse 94, temperature (!) 97.5 F (36.4 C), resp. rate 14, height 5\' 5"  (1.651 m), last menstrual period 03/23/2019, SpO2 96 %.   Disposition: Discharge disposition: 01-Home or Self Care       Discharge Instructions    Call MD for:  difficulty breathing, headache or visual disturbances   Complete by: As directed    Call MD for:  extreme fatigue   Complete by: As directed    Call MD for:  hives   Complete by: As directed    Call MD for:  persistant dizziness or light-headedness   Complete by: As directed    Call MD for:  persistant nausea and vomiting   Complete by: As directed    Call MD for:  redness, tenderness, or signs of infection (pain, swelling, redness, odor or green/yellow discharge around incision site)   Complete by: As directed    Call MD for:  severe uncontrolled pain   Complete by: As  directed    Call MD for:  temperature >100.4   Complete by: As directed    Diet general   Complete by: As directed    Discharge wound care:   Complete by: As directed    Keep port sites clean and dry.   Driving Restrictions   Complete by: As directed    No driving for 2 weeks   Increase activity slowly   Complete by: As directed    Lifting restrictions   Complete by: As directed    No heavy lifting for 6 weeks   Sexual Activity Restrictions   Complete by: As directed    No sexual intercourse for 8 weeks     Allergies as of 04/05/2019      Reactions   Sulfa Antibiotics Itching   Sulfur Itching      Medication List    STOP taking these medications   ondansetron 4 MG disintegrating tablet Commonly known as: Zofran ODT   oxyCODONE 5 MG immediate release tablet Commonly known as: Roxicodone   zolpidem 10 MG tablet Commonly known as: AMBIEN     TAKE these medications   diclofenac 50 MG tablet Commonly known as: CATAFLAM Take 1 tablet (50 mg total) by mouth 3 (three) times daily as needed.   HYDROcodone-acetaminophen 5-325 MG tablet Commonly known as: NORCO/VICODIN Take 1 tablet by mouth every 6 (six) hours as needed for moderate pain.            Discharge Care Instructions  (From admission, onward)  Start     Ordered   04/05/19 0000  Discharge wound care:    Comments: Keep port sites clean and dry.   04/05/19 [place].   Specialty: Obstetrics and Gynecology Contact information: [place] [phone]          Signed: Lavonia Drafts 04/06/2019, 10:33 PM

## 2019-04-06 NOTE — Telephone Encounter (Signed)
Patient called to be given her post op appointments. Patient called stating that she is having pain in her diaphragm area patient is having to pause every 4-5 words to take a breathe. Patient states she is having pain when she lays down flat. Patient keeps stating that other people also feel this way. Patient is having pain between her shoulder blades when she takes deep breathe. Patient keeps asking what other patients feel after this kinda surgery.  Patient has not taken any hydrocodone. Is worried it is going to to make her throw up. Patient would something for nausea. She not having nausea at the moment but is worried if she takes the hydrocodone she will be sick. Instructed to take and have some food on her stomach before taking the hydrocodone. Patient then asked if this hydrocodone is similar to the cough syrup they give to someone with bronchitis because she did not react well to that. Made patient aware I cant advise as she does not know the name of the cough syrup she took in the past.   Patient was again advised that if her breathing/ pain symptoms do not improve she should return to [facility] long ED for evaluation. Patient asked if Dr. Ihor Dow will check on her. Patient again asked if other patients feel this way. She is just having terrible pain between her shoulder blades when she tries to take a big breather. Again advised her to return to ED at this time. Will route her concerns to Dr. Ihor Dow to look over. (phone call 21 minutes in length) Kathrene Alu RN

## 2019-04-07 ENCOUNTER — Telehealth: Payer: Self-pay | Admitting: Obstetrics & Gynecology

## 2019-04-07 NOTE — Telephone Encounter (Signed)
TC to pt to check on her post op. She reports that she is doing 'ok'. She reports SOB and like 'bubbles were moving about her shoulders.' That pain is gone now.  She reports she had no abdominal pain or soreness.  She is voiding without difficulty.   Pt has not been taking narc for pain meds. She is taking NSAIDS.   Elene Downum L. Harraway-Smith, M.D., Cherlynn June

## 2019-04-20 ENCOUNTER — Ambulatory Visit (INDEPENDENT_AMBULATORY_CARE_PROVIDER_SITE_OTHER): Payer: 59 | Admitting: Obstetrics & Gynecology

## 2019-04-20 ENCOUNTER — Encounter: Payer: Self-pay | Admitting: Obstetrics & Gynecology

## 2019-04-20 ENCOUNTER — Other Ambulatory Visit: Payer: Self-pay

## 2019-04-20 VITALS — BP 102/65 | HR 91 | Ht 65.0 in | Wt 137.0 lb

## 2019-04-20 DIAGNOSIS — Z9889 Other specified postprocedural states: Secondary | ICD-10-CM

## 2019-04-20 NOTE — Progress Notes (Signed)
2 week post op (robotic hysterectomy). Kathrene Alu RN

## 2019-04-20 NOTE — Patient Instructions (Signed)

## 2019-04-20 NOTE — Progress Notes (Signed)
History:  49 y.o.LMP here today for 2 week post op check.Pt is s/p RATH with bilateral salpingectomy on 04/05/2019.  Pt reports that she is doing well. She is eating and passing stols without difficulty.  Pt reports that he back pain is gone and the pain that she had on her left side. She took none of the pain meds post op. She did have quite a bit gas pain.     The following portions of the patient's history were reviewed and updated as appropriate: allergies, current medications, past family history, past medical history, past social history, past surgical history and problem list.  Review of Systems:  Pertinent items are noted in HPI.    Objective:  Physical Exam BP 102/65   Pulse 91   Ht 5\' 5"  (1.651 m)   Wt 137 lb (62.1 kg)   LMP 03/23/2019   BMI 22.80 kg/m   CONSTITUTIONAL: Well-developed, well-nourished female in no acute distress.  HENT:  Normocephalic, atraumatic EYES: Conjunctivae and EOM are normal. No scleral icterus.  NECK: Normal range of motion SKIN: Skin is warm and dry. No rash noted. Not diaphoretic.No pallor. [place]: Alert and oriented to person, place, and time. Normal coordination.  Abd: Soft, nontender and nondistended; her port sites are healing well. .  Pelvic: deferred  Labs and Imaging Surg path 04/05/2019 FINAL MICROSCOPIC DIAGNOSIS:   A. UTERUS, CERVIX, PORTION OF BILATERAL FALLOPIAN TUBES, HYSTERECTOMY  WITH SALPING:  - Cervix: Unremarkable.  - Endometrium: Benign secretory type endometrium.  - Myometrium: Leiomyomata.  Adenomyosis.  - Serosa: Benign serous cyst.  - Fallopian tubes: Paratubal cyst.    Assessment & Plan:  2 week post op check following RATH with bilateral salpingectomy.   Doing well  Reviewed her surg path.   Reviewed post op instructions and activities  Gradual increase in activities  F/u in 4 weeks or sooner prn  Reviewed no intercourse for 8 weeks post op  All questions answered.   Lucious Zou L. Harraway-Smith, M.D.,  [place]lynn June

## 2019-05-18 ENCOUNTER — Encounter: Payer: 59 | Admitting: Obstetrics & Gynecology

## 2019-06-01 ENCOUNTER — Encounter: Payer: Self-pay | Admitting: Obstetrics & Gynecology

## 2019-06-01 ENCOUNTER — Ambulatory Visit (HOSPITAL_BASED_OUTPATIENT_CLINIC_OR_DEPARTMENT_OTHER): Payer: 59 | Admitting: Obstetrics & Gynecology

## 2019-06-01 ENCOUNTER — Other Ambulatory Visit: Payer: Self-pay

## 2019-06-01 VITALS — BP 102/70 | HR 92 | Ht 65.0 in | Wt 135.0 lb

## 2019-06-01 DIAGNOSIS — Z90710 Acquired absence of both cervix and uterus: Secondary | ICD-10-CM

## 2019-06-01 DIAGNOSIS — Z9889 Other specified postprocedural states: Secondary | ICD-10-CM

## 2019-06-01 DIAGNOSIS — Z9079 Acquired absence of other genital organ(s): Secondary | ICD-10-CM

## 2019-06-01 DIAGNOSIS — Z48816 Encounter for surgical aftercare following surgery on the genitourinary system: Secondary | ICD-10-CM

## 2019-06-01 NOTE — Progress Notes (Signed)
History:  49 y.o.LMP here today for 8 week post op check.Pt is s/p RATH with bilateral salpingectomy on 04/05/2019.  Pt reports that she is doing well. She is eating and passing stols without difficulty.   She became sexually active 3 days ago with no issues.   The following portions of the patient's history were reviewed and updated as appropriate: allergies, current medications, past family history, past medical history, past social history, past surgical history and problem list.  Review of Systems:  Pertinent items are noted in HPI.    Objective:  Physical Exam BP 102/70   Pulse 92   Ht 5\' 5"  (1.651 m)   Wt 135 lb (61.2 kg)   LMP 03/23/2019   BMI 22.47 kg/m   CONSTITUTIONAL: Well-developed, well-nourished female in no acute distress.  HENT:  Normocephalic, atraumatic EYES: Conjunctivae and EOM are normal. No scleral icterus.  NECK: Normal range of motion SKIN: Skin is warm and dry. No rash noted. Not diaphoretic.No pallor. [place]: Alert and oriented to person, place, and time. Normal coordination.  Abd: Soft, nontender and nondistended; her port sites are healing well. .  Pelvic: deferred  Labs and Imaging Surg path 04/05/2019 UTERUS, CERVIX, PORTION OF BILATERAL FALLOPIAN TUBES, HYSTERECTOMY  WITH SALPING:  - Cervix: Unremarkable.  - Endometrium: Benign secretory type endometrium.  - Myometrium: Leiomyomata.  Adenomyosis.  - Serosa: Benign serous cyst.  - Fallopian tubes: Paratubal cyst.    Assessment & Plan:  8 week post op check following RATH with bilateral salpingectomy.   Doing well  Reviewed her surg path.   Reviewed post op instructions and activities  Gradual increase in activities  F/u in 3 months or sooner prn  All questions answered.   Camaryn Lumbert L. Harraway-Smith, M.D., Cherlynn June

## 2019-06-23 ENCOUNTER — Telehealth: Payer: Self-pay | Admitting: *Deleted

## 2019-06-23 NOTE — Telephone Encounter (Signed)
Left patient a message to call and schedule F/U in 3 months on 08/02 or 08/22/19 with Dr. Gala Romney per Dr. Ihor Dow.

## 2019-06-24 ENCOUNTER — Ambulatory Visit: Payer: Self-pay | Admitting: Cardiology

## 2019-07-14 ENCOUNTER — Telehealth: Payer: Self-pay | Admitting: *Deleted

## 2019-07-14 NOTE — Telephone Encounter (Signed)
Left patient a message to call and schedule F/U in 3 months on 08/02 or 08/22/19 with Dr. Gala Romney per Dr. Ihor Dow.

## 2019-12-08 ENCOUNTER — Ambulatory Visit (INDEPENDENT_AMBULATORY_CARE_PROVIDER_SITE_OTHER): Payer: 59 | Admitting: Obstetrics and Gynecology

## 2019-12-08 ENCOUNTER — Other Ambulatory Visit: Payer: Self-pay

## 2019-12-08 ENCOUNTER — Encounter: Payer: Self-pay | Admitting: Obstetrics and Gynecology

## 2019-12-08 VITALS — BP 106/71 | HR 95 | Resp 16 | Ht 65.0 in | Wt 138.0 lb

## 2019-12-08 DIAGNOSIS — N951 Menopausal and female climacteric states: Secondary | ICD-10-CM

## 2019-12-08 DIAGNOSIS — Z1231 Encounter for screening mammogram for malignant neoplasm of breast: Secondary | ICD-10-CM

## 2019-12-08 NOTE — Progress Notes (Signed)
GYNECOLOGY OFFICE NOTE  History:  49 y.o. G1P0010 here today for discussion of estradiol started by PCP for hot flashes. Started on 0.5 mg and went up to 1 mg daily, has had improvement on the 1 mg dose. Wondering if she needs to switch to another formulation (if another form would be better), if this is related to hysterectomy. Not having other symptoms except hot flushes. Describes them not as hot flushes but more as just getting really hot.  Past Medical History:  Diagnosis Date  . Anemia   . Heart murmur     Past Surgical History:  Procedure Laterality Date  . ROBOTIC ASSISTED TOTAL HYSTERECTOMY WITH BILATERAL SALPINGO OOPHERECTOMY Bilateral 04/05/2019   Procedure: XI ROBOTIC ASSISTED TOTAL HYSTERECTOMY WITH BILATERAL SALPINGECTOMY AND CYSTOSCOPY;  Surgeon: Lavonia Drafts, MD;  Location: [facility];  Service: Gynecology;  Laterality: Bilateral;  . TUBAL LIGATION       Current Outpatient Medications:  .  estradiol (ESTRACE) 0.5 MG tablet, Take 1 mg by mouth daily. , Disp: , Rfl:  .  zolpidem (AMBIEN) 10 MG tablet, Take 10 mg by mouth at bedtime as needed for sleep., Disp: , Rfl:  .  diclofenac (CATAFLAM) 50 MG tablet, Take 1 tablet (50 mg total) by mouth 3 (three) times daily as needed. (Patient not taking: Reported on 06/01/2019), Disp: 60 tablet, Rfl: 0  The following portions of the patient's history were reviewed and updated as appropriate: allergies, current medications, past family history, past medical history, past social history, past surgical history and problem list.   Review of Systems:  Pertinent items noted in HPI and remainder of comprehensive ROS otherwise negative.   Objective:  Physical Exam BP 106/71   Pulse 95   Resp 16   Ht 5\' 5"  (1.651 m)   Wt 138 lb (62.6 kg)   LMP 03/23/2019   BMI 22.96 kg/m  CONSTITUTIONAL: Well-developed, well-nourished female in no acute distress.  HENT:  Normocephalic, atraumatic. External right and  left ear normal. Oropharynx is clear and moist EYES: Conjunctivae and EOM are normal. Pupils are equal, round, and reactive to light. No scleral icterus.  NECK: Normal range of motion, supple, no masses SKIN: Skin is warm and dry. No rash noted. Not diaphoretic. No erythema. No pallor. NEUROLOGIC: Alert and oriented to person, place, and time. Normal reflexes, muscle tone coordination. No cranial nerve deficit noted. PSYCHIATRIC: Normal mood and affect. Normal behavior. Normal judgment and thought content. CARDIOVASCULAR: Normal heart rate noted RESPIRATORY: Effort normal, no problems with respiration noted ABDOMEN: Soft, no distention noted.   PELVIC: deferred MUSCULOSKELETAL: Normal range of motion. No edema noted.  Labs and Imaging No results found.  Assessment & Plan:   1. Encounter for screening mammogram for malignant neoplasm of breast - MM 3D SCREEN BREAST BILATERAL; Future  2. Hot flushes, perimenopausal - Pt started on estradiol by PCP, reviewed other options for HRT (patch, ring) and reviewed that if she is getting symptomatic relief then would continue on the estradiol - reviewed risks of HRT and recommendation to be on for the lowest dose for the shortest amount of time possible - reviewed no need for progesterone as she is s/p hysterectomy - reviewed increased risks for breast cancer and importance of screening mammogram - cont estradiol  Routine preventative health maintenance measures emphasized. Please refer to After Visit Summary for other counseling recommendations.   Return in about 6 months (around 06/07/2020), or if symptoms worsen or fail to improve.  Total  face-to-face time with patient: 32 minutes. Over 50% of encounter was spent on counseling and coordination of care.  Feliz Beam, M.D. Attending Center for [employer])

## 2019-12-12 ENCOUNTER — Encounter: Payer: Self-pay | Admitting: Obstetrics and Gynecology

## 2019-12-20 ENCOUNTER — Ambulatory Visit: Payer: Self-pay | Admitting: Podiatry

## 2020-01-03 ENCOUNTER — Other Ambulatory Visit: Payer: Self-pay

## 2020-01-03 ENCOUNTER — Ambulatory Visit (INDEPENDENT_AMBULATORY_CARE_PROVIDER_SITE_OTHER): Payer: 59

## 2020-01-03 ENCOUNTER — Ambulatory Visit (INDEPENDENT_AMBULATORY_CARE_PROVIDER_SITE_OTHER): Payer: 59 | Admitting: Podiatry

## 2020-01-03 DIAGNOSIS — G8929 Other chronic pain: Secondary | ICD-10-CM | POA: Diagnosis not present

## 2020-01-03 DIAGNOSIS — M722 Plantar fascial fibromatosis: Secondary | ICD-10-CM

## 2020-01-03 DIAGNOSIS — M79673 Pain in unspecified foot: Secondary | ICD-10-CM | POA: Diagnosis not present

## 2020-01-03 NOTE — Patient Instructions (Signed)

## 2020-01-06 NOTE — Progress Notes (Signed)
Subjective:   Patient ID: Ashley Shah, female   DOB: 49 y.o.   MRN: [id number]   HPI 49 year old female presents the office today for concerns of chronic heel pain which is been ongoing for several years.  She states that she is been doing some research and she has been taking 800 mg ibuprofen as well as compression which does help.  She gets pain along the point when she first gets up in the morning or after time of rest.  Also she stands a lot at work which aggravates her symptoms.  No radiating pain.  She does describe burning, sharp pain to the bottom of the heel.  She has no other concerns today.   Review of Systems  All other systems reviewed and are negative.  Past Medical History:  Diagnosis Date  . Anemia   . Heart murmur     Past Surgical History:  Procedure Laterality Date  . ROBOTIC ASSISTED TOTAL HYSTERECTOMY WITH BILATERAL SALPINGO OOPHERECTOMY Bilateral 04/05/2019   Procedure: XI ROBOTIC ASSISTED TOTAL HYSTERECTOMY WITH BILATERAL SALPINGECTOMY AND CYSTOSCOPY;  Surgeon: Willodean Rosenthal, MD;  Location: [facility];  Service: Gynecology;  Laterality: Bilateral;  . TUBAL LIGATION       Current Outpatient Medications:  .  diclofenac (CATAFLAM) 50 MG tablet, Take 1 tablet (50 mg total) by mouth 3 (three) times daily as needed. (Patient not taking: Reported on 06/01/2019), Disp: 60 tablet, Rfl: 0 .  estradiol (ESTRACE) 0.5 MG tablet, Take 1 mg by mouth daily. , Disp: , Rfl:  .  estradiol (ESTRACE) 1 MG tablet, Take 1 mg by mouth daily., Disp: , Rfl:  .  zolpidem (AMBIEN) 10 MG tablet, Take 10 mg by mouth at bedtime as needed for sleep., Disp: , Rfl:   Allergies  Allergen Reactions  . Sulfa Antibiotics Itching  . Sulfur Itching        Objective:  Physical Exam  General: AAO x3, NAD  Dermatological: Skin is warm, dry and supple bilateral. There are no open sores, no preulcerative lesions, no rash or signs of infection present.  Vascular:  Dorsalis Pedis artery and Posterior Tibial artery pedal pulses are 2/4 bilateral with immedate capillary fill time.  There is no pain with calf compression, swelling, warmth, erythema.   Neruologic: Grossly intact via light touch bilateral. Negative tinel sign.   Musculoskeletal: Tenderness to palpation along the plantar medial tubercle of the calcaneus at the insertion of plantar fascia on the right > left foot. There is no pain along the course of the plantar fascia within the arch of the foot. Plantar fascia appears to be intact. There is no pain with lateral compression of the calcaneus or pain with vibratory sensation. There is no pain along the course or insertion of the achilles tendon. No other areas of tenderness to bilateral lower extremities. Muscular strength 5/5 in all groups tested bilateral.  Gait: Unassisted, Nonantalgic.       Assessment:   Bilateral chronic plantar fasciitis    Plan:  -Treatment options discussed including all alternatives, risks, and complications -Etiology of symptoms were discussed -X-rays were obtained and reviewed with the patient.  No subacute fracture or stress fracture.  Small calcaneal spurring evident. -We discussed multiple treatment options.  She is continued with anti-inflammatories.  Discussed steroid injection but she denies significant pain today so I will hold off on this.  We discussed stretching, icing daily.  We discussed shoe modifications.  She has been wearing "Hey Dude" shoes without  support and I think this may be contributing to her symptoms.     Trula Slade DPM

## 2020-01-07 HISTORY — PX: BREAST REDUCTION SURGERY: SHX8

## 2020-07-05 ENCOUNTER — Encounter: Payer: Self-pay | Admitting: Obstetrics and Gynecology

## 2020-10-04 ENCOUNTER — Ambulatory Visit
Admission: RE | Admit: 2020-10-04 | Discharge: 2020-10-04 | Disposition: A | Discharge status: Home / Self Care | Payer: 59 | Source: Ambulatory Visit | Attending: Physician Assistant | Admitting: Physician Assistant

## 2020-10-04 ENCOUNTER — Other Ambulatory Visit: Payer: Self-pay | Admitting: Physician Assistant

## 2020-10-04 ENCOUNTER — Other Ambulatory Visit: Payer: Self-pay

## 2020-10-04 DIAGNOSIS — Z1231 Encounter for screening mammogram for malignant neoplasm of breast: Secondary | ICD-10-CM

## 2020-10-11 ENCOUNTER — Encounter: Payer: Self-pay | Admitting: Plastic Surgery

## 2020-10-11 ENCOUNTER — Ambulatory Visit (INDEPENDENT_AMBULATORY_CARE_PROVIDER_SITE_OTHER): Payer: 59 | Admitting: Plastic Surgery

## 2020-10-11 ENCOUNTER — Other Ambulatory Visit: Payer: Self-pay

## 2020-10-11 VITALS — BP 118/81 | HR 84 | Ht 65.0 in | Wt 142.6 lb

## 2020-10-11 DIAGNOSIS — M545 Low back pain, unspecified: Secondary | ICD-10-CM

## 2020-10-11 DIAGNOSIS — M4004 Postural kyphosis, thoracic region: Secondary | ICD-10-CM

## 2020-10-11 DIAGNOSIS — Z411 Encounter for cosmetic surgery: Secondary | ICD-10-CM

## 2020-10-11 DIAGNOSIS — N62 Hypertrophy of breast: Secondary | ICD-10-CM

## 2020-10-11 DIAGNOSIS — M546 Pain in thoracic spine: Secondary | ICD-10-CM

## 2020-10-11 NOTE — Progress Notes (Signed)
Referring Provider Trey Sailors, PA [address] [place],  [place] [id number]   CC:  Chief Complaint  Patient presents with   consult      Ashley Shah is an 50 y.o. female.  HPI: Patient presents to discuss breast reduction.  She is had years of back pain, neck pain and shoulder grooving later to her large breast.  She tried over-the-counter medications, warm packs, cold packs and supportive bras with little relief.  She is currently around a double D cup and would like to be around a C cup.  She states her left breast is larger than her right.  She has quite a bit of problems with her back.  She does have scoliosis.  Her bra strap pain is significant as well.  She does not smoke and is not a diabetic.  She has been to chiropractor for 3 years with little sustained relief of her pain.  She is up-to-date on her mammograms and they have been fine.  No previous breast biopsies or procedures.  She is also bothered by her abdominal contour.  Only previous abdominal surgery is a robotic hysterectomy.  She would like to see if anything could be done to improve that surgically.  Allergies  Allergen Reactions   Elemental Sulfur Itching   Sulfa Antibiotics Itching    Outpatient Encounter Medications as of 10/11/2020  Medication Sig   estradiol (ESTRACE) 1 MG tablet Take 1 mg by mouth daily.   zolpidem (AMBIEN) 10 MG tablet Take 10 mg by mouth at bedtime as needed for sleep.   diclofenac (CATAFLAM) 50 MG tablet Take 1 tablet (50 mg total) by mouth 3 (three) times daily as needed. (Patient not taking: Reported on 06/01/2019)   estradiol (ESTRACE) 0.5 MG tablet Take 1 mg by mouth daily.    No facility-administered encounter medications on file as of 10/11/2020.     Past Medical History:  Diagnosis Date   Anemia    Heart murmur     Past Surgical History:  Procedure Laterality Date   ROBOTIC ASSISTED TOTAL HYSTERECTOMY WITH BILATERAL SALPINGO OOPHERECTOMY Bilateral 04/05/2019    Procedure: XI ROBOTIC ASSISTED TOTAL HYSTERECTOMY WITH BILATERAL SALPINGECTOMY AND CYSTOSCOPY;  Surgeon: Lavonia Drafts, MD;  Location: [facility];  Service: Gynecology;  Laterality: Bilateral;   TUBAL LIGATION      Family History  Problem Relation Age of Onset   Cancer Maternal Grandfather    Brain cancer Paternal Grandfather    Breast cancer Neg Hx     Social History   Social History Narrative   ** Merged History Encounter **         Review of Systems General: Denies fevers, chills, weight loss CV: Denies chest pain, shortness of breath, palpitations  Physical Exam Vitals with BMI 10/11/2020 12/08/2019 06/01/2019  Height 5\' 5"  5\' 5"  5\' 5"   Weight 142 lbs 10 oz 138 lbs 135 lbs  BMI 23.73 17.00 17.49  Systolic 449 675 916  Diastolic 81 71 70  Pulse 84 95 92    General:  No acute distress,  Alert and oriented, Non-Toxic, Normal speech and affect Breast: She has grade 2 ptosis.  Sternal notch to nipple is 26 cm on the right and 27 cm on the left.  Nipple to fold is 10 cm on the right and 11 cm on the left.  She is bigger on the left side than the right.  No obvious scars or masses. Abdomen: Abdomen is soft nontender.  No obvious hernias.  She has mild to moderate excess skin and adipose tissue in the infra and supraumbilical areas.  Small scars from her hysterectomy.  Assessment/Plan The patient has bilateral symptomatic macromastia.  She is a good candidate for a breast reduction.  She is interested in pursuing surgical treatment.  She has tried supportive garments and fitted bras with no relief.  The details of breast reduction surgery were discussed.  I explained the procedure in detail along the with the expected scars.  The risks were discussed in detail and include bleeding, infection, damage to surrounding structures, need for additional procedures, nipple loss, change in nipple sensation, persistent pain, contour irregularities and asymmetries.  I  explained that breast feeding is often not possible after breast reduction surgery.  We discussed the expected postoperative course with an overall recovery period of about 1 month.  She demonstrated full understanding of all risks.  We discussed her personal risk factors.  The patient is interested in pursuing surgical treatment.  I anticipate approximately 375g of tissue removed from each side.  Regarding the abdomen I do think she is a good candidate for abdominoplasty.  We discussed total abdominoplasty with liposuction and plication.  We discussed the risk associated with that along with the location and orientation of the scars.  I explained the need for drains postoperatively.  All of her questions were answered and we will plan to move forward with this as well.   [name] 10/11/2020, 4:49 PM

## 2020-10-22 ENCOUNTER — Telehealth: Payer: Self-pay | Admitting: Plastic Surgery

## 2020-10-22 NOTE — Telephone Encounter (Signed)
Returned patient's call regarding clarification on surgery locations and needing to split the cases up. Explained to patient that [facility] does not take her insurance which is [place] therefore the surgery would have to be entirely cosmetic or it would have to be split (reduction at [facility] facility and cosmetic part at [facility] Patient does not want to do reduction cosmetically if her insurance will cover it so she would like for [place] to submit to her insurance to see what they say. Advised patient that Dr. Claudia Desanctis stated there should be 6 weeks minimum between the reduction and the abdominoplasty. Patient stated understanding. Will review the notes and submit reduction to insurance. Advd patient I will reach out to her once I have response from insurance.

## 2020-11-05 ENCOUNTER — Telehealth: Payer: Self-pay | Admitting: Plastic Surgery

## 2020-11-05 NOTE — Telephone Encounter (Signed)
LVM for patient advising that [facility] withdrew the surgery authorization due to a new plan being offered in 2023. [facility] will not be offering the same plan patient has therefore they cannot authorize a surgery for 2023. Per rep, Maida, members were notified via mail of this change. Advised patient to call us with new insurance plan and id number so we can submit authorization under new insurance plan for January since that is when she wanted to have surgery.

## 2020-11-26 ENCOUNTER — Other Ambulatory Visit: Payer: Self-pay | Admitting: *Deleted

## 2020-11-26 ENCOUNTER — Other Ambulatory Visit: Payer: Self-pay

## 2020-11-26 ENCOUNTER — Ambulatory Visit: Payer: 59 | Admitting: Obstetrics & Gynecology

## 2020-11-26 ENCOUNTER — Encounter: Payer: Self-pay | Admitting: Obstetrics & Gynecology

## 2020-11-26 VITALS — BP 119/73 | HR 97 | Ht 65.0 in | Wt 145.0 lb

## 2020-11-26 DIAGNOSIS — F419 Anxiety disorder, unspecified: Secondary | ICD-10-CM

## 2020-11-26 DIAGNOSIS — G479 Sleep disorder, unspecified: Secondary | ICD-10-CM

## 2020-11-26 DIAGNOSIS — R635 Abnormal weight gain: Secondary | ICD-10-CM

## 2020-11-26 DIAGNOSIS — R6882 Decreased libido: Secondary | ICD-10-CM | POA: Diagnosis not present

## 2020-11-26 DIAGNOSIS — N951 Menopausal and female climacteric states: Secondary | ICD-10-CM

## 2020-11-26 MED ORDER — ESTRADIOL 0.5 MG PO TABS: 1.5000 mg | ORAL_TABLET | Freq: Every day | ORAL | 3 refills | Status: DC

## 2020-11-26 MED ORDER — ESTRADIOL 0.5 MG PO TABS: 1.5000 mg | ORAL_TABLET | Freq: Every day | ORAL | 3 refills | Status: AC

## 2020-11-26 MED ORDER — SERTRALINE HCL 25 MG PO TABS: ORAL_TABLET | ORAL | 2 refills | Status: DC

## 2020-11-26 MED ORDER — ZOLPIDEM TARTRATE 10 MG PO TABS
10.0000 mg | ORAL_TABLET | Freq: Every evening | ORAL | 0 refills | Status: DC | PRN
Start: 2020-11-26 — End: 2020-11-26

## 2020-11-26 NOTE — Progress Notes (Signed)
PHQ 9 Score: 5 GAD Score: 7

## 2020-11-26 NOTE — Addendum Note (Signed)
Addended by: Guss Bunde on: 11/26/2020 04:19 PM   Modules accepted: Orders

## 2020-11-26 NOTE — Progress Notes (Signed)
RX given for Testosterone cream 2%.  Apply pea size amount 3 times weekly.  Disp 15 Gm with 1 RF per Dr Gala Romney

## 2020-11-26 NOTE — Progress Notes (Signed)
Pt declined flu shot °

## 2020-11-26 NOTE — Patient Instructions (Addendum)
Vivelle dot patches 0.075 or 0.1  Check insurance

## 2020-11-26 NOTE — Progress Notes (Signed)
   Subjective:    Patient ID: Ashley Shah, female    DOB: 1970/08/16, 50 y.o.   MRN: [id number]  HPI  50 year old female presents for complaining of hot flashes.  She is on 1 mg of estradiol which helped her for a while but she feels like the hot flashes are back.  She also very concerned about her weight gain.  She does have some decreased libido.  She has difficulty sleeping and takes Ambien 10 mg nightly.  She says she cannot make her mind turned off.  She is she does have anxiety which has never been treated with medications.  She is little hesitant to start medications but is willing to give it a try.  She is very also concerned about weight gain.  I assured her that these medications should not cause weight gain.  Patient is interested in estrogen pellets which appears to be bioidentical hormones.  I explained the patient that we do not prescribe those but if she would like that therapy she should proceed with an appointment with [facility] in [place].  Review of Systems  Constitutional:  Positive for unexpected weight change.  Respiratory: Negative.    Cardiovascular: Negative.   Gastrointestinal: Negative.   Endocrine: Positive for heat intolerance.  Genitourinary: Negative.   Neurological: Negative.   Psychiatric/Behavioral:  Positive for sleep disturbance. The patient is nervous/anxious.       Objective:   Physical Exam Vitals reviewed.  Constitutional:      General: She is not in acute distress.    Appearance: She is well-developed.  HENT:     Head: Normocephalic and atraumatic.  Eyes:     Conjunctiva/sclera: Conjunctivae normal.  Cardiovascular:     Rate and Rhythm: Normal rate.  Pulmonary:     Effort: Pulmonary effort is normal.  Skin:    General: Skin is warm and dry.  Neurological:     Mental Status: She is alert and oriented to person, place, and time.  Psychiatric:        Mood and Affect: Mood normal.   Vitals:   11/26/20 1307  BP: 119/73   Pulse: 97  Weight: 145 lb (65.8 kg)  Height: 5\' 5"  (1.651 m)   Assessment & Plan:  50 year old female with menopausal symptoms at 1 mg of oral estradiol. Will increase to 1.5 mg of estradiol.  She can increase up to 2 mg daily.  I reiterated that being on the lowest amount of estrogen for the shortest amount of time is best for her.  She understands that estrogen increases her risk of breast cancer which can be more aggressive, clot and stroke. Patient has some decreased libido.  She is interested in testosterone.  Topical testosterone was prescribed.  If patient shows signs of any masculinization she should stop immediately. Patient also complaining of anxiety and scored 7 on her GAD-7.  She does have sleep disturbances and cannot turn her mind off.  She scored a 5 on her PHQ-9.  Patient is amenable to starting Zoloft 25 mg daily with the option to increase to 50 mg after 2 weeks.  Patient is to report any side effects.  RTC 2 months   43 spent with reviewing records, interviewing patient, discussing treatment plans and options, administering depression and anxiety screens, and documentation.

## 2021-01-09 DIAGNOSIS — N62 Hypertrophy of breast: Secondary | ICD-10-CM | POA: Diagnosis not present

## 2021-01-09 DIAGNOSIS — G479 Sleep disorder, unspecified: Secondary | ICD-10-CM | POA: Diagnosis not present

## 2021-01-09 DIAGNOSIS — R69 Illness, unspecified: Secondary | ICD-10-CM | POA: Diagnosis not present

## 2021-01-09 DIAGNOSIS — E559 Vitamin D deficiency, unspecified: Secondary | ICD-10-CM | POA: Diagnosis not present

## 2021-01-09 DIAGNOSIS — D509 Iron deficiency anemia, unspecified: Secondary | ICD-10-CM | POA: Diagnosis not present

## 2021-01-09 DIAGNOSIS — R232 Flushing: Secondary | ICD-10-CM | POA: Diagnosis not present

## 2021-01-09 DIAGNOSIS — M4125 Other idiopathic scoliosis, thoracolumbar region: Secondary | ICD-10-CM | POA: Diagnosis not present

## 2021-01-09 DIAGNOSIS — E785 Hyperlipidemia, unspecified: Secondary | ICD-10-CM | POA: Diagnosis not present

## 2021-01-10 ENCOUNTER — Encounter: Payer: Self-pay | Admitting: Plastic Surgery

## 2021-01-21 ENCOUNTER — Telehealth: Payer: Self-pay | Admitting: Plastic Surgery

## 2021-01-21 NOTE — Telephone Encounter (Signed)
LVM advising patient that insurance is still denying her breast reduction surgery authorization so the entire surgery will be cosmetic.Advd she is welcome to call back if she has further questions.

## 2021-01-28 ENCOUNTER — Ambulatory Visit: Payer: 59 | Admitting: Obstetrics & Gynecology

## 2021-01-28 NOTE — Progress Notes (Signed)
Patient ID: Ashley Shah, female    DOB: 09/10/1970, 51 y.o.   MRN: [id number]  Chief Complaint  Patient presents with   Pre-op Exam      ICD-10-CM   1. Macromastia  N62        History of Present Illness: Ashley Shah is a 51 y.o.  female  with a history of symptomatic mammary hypertrophy as well as robotic hysterectomy.  She presents for preoperative evaluation for upcoming procedure, bilateral breast reduction and abdominoplasty with liposuction, scheduled for 02/15/2021 with Dr. Claudia Desanctis.  The patient has not had problems with anesthesia.  Patient confirms that she is a DD cup and would like to be a C cup.  Patient reports that she likes the size of her right breast and that the left breast is much bigger.  She reports that her absolute primary concern is with regard to her excess axillary tissue/lateral breast.  She is hoping this can be addressed with the reduction and liposuction.  She also reinforces concerns regarding her flanks.  I informed patient that the flanks are not typically addressed during abdominoplasty, but that I would least bring this to the attention of the surgeon.  She denies any personal or family history of blood clots or clotting disorder.  No history of cancer.  Denies COPD or asthma.  She does not smoke.  Denies any recent illness, infection, or traumas.  She does take oral estrogen for perimenopausal symptoms which she will hold perioperatively.  She also takes phentermine as well as over-the-counter supplements and vitamins which she will hold 1 week prior to surgery.  Patient endorses allergies to sulfa medications.    Summary of Previous Visit: Patient was seen for consult on 06/25/2020 by Dr. Claudia Desanctis.  At that time, she complained of chronic back and neck discomfort in context of large breasts.  She is currently DD cup, would prefer to be a C cup.  She does endorse asymmetry, left breast slightly larger than the right.  She has been to a chiropractor without  relief.  She is also bothered by her abdominal contour and was inquiring about possible surgical intervention.  STN 26 cm in the right, 27 cm on the left.  Estimated excess breast tissue removed at time of surgery equals 375 g each side.  Discussed both breast reduction and abdominoplasty surgeries and patient expressed desire to proceed.  She understands that she will need postoperative drains.  Job: Hairstylist.  PMH Significant for: Robotic hysterectomy, scoliosis, macromastia.   Past Medical History: Allergies: Allergies  Allergen Reactions   Elemental Sulfur Itching   Sulfa Antibiotics Itching and Other (See Comments)    Current Medications:  Current Outpatient Medications:    estradiol (ESTRACE) 0.5 MG tablet, Take 3 tablets (1.5 mg total) by mouth daily., Disp: 90 tablet, Rfl: 3   zolpidem (AMBIEN) 10 MG tablet, Take 10 mg by mouth at bedtime., Disp: , Rfl:    sertraline (ZOLOFT) 25 MG tablet, Take 1 tablet every morning for 2 weeks.  Increase to 2 tablets in the morning as needed and if no side effects., Disp: 90 tablet, Rfl: 2  Past Medical Problems: Past Medical History:  Diagnosis Date   Anemia    Heart murmur     Past Surgical History: Past Surgical History:  Procedure Laterality Date   ROBOTIC ASSISTED TOTAL HYSTERECTOMY WITH BILATERAL SALPINGO OOPHERECTOMY Bilateral 04/05/2019   Procedure: XI ROBOTIC ASSISTED TOTAL HYSTERECTOMY WITH BILATERAL SALPINGECTOMY AND CYSTOSCOPY;  Surgeon:  Lavonia Drafts, MD;  Location: [facility];  Service: Gynecology;  Laterality: Bilateral;   TUBAL LIGATION      Social History: Social History   Socioeconomic History   Marital status: Unknown    Spouse name: Not on file   Number of children: Not on file   Years of education: Not on file   Highest education level: Not on file  Occupational History   Not on file  Tobacco Use   Smoking status: Never   Smokeless tobacco: Never  Vaping Use   Vaping Use:  Never used  Substance and Sexual Activity   Alcohol use: Yes    Comment: occ.   Drug use: Never   Sexual activity: Yes    Birth control/protection: Surgical  Other Topics Concern   Not on file  Social History Narrative   ** Merged History Encounter **       Social Determinants of Health   Financial Resource Strain: Not on file  Food Insecurity: Not on file  Transportation Needs: Not on file  Physical Activity: Not on file  Stress: Not on file  Social Connections: Not on file  Intimate Partner Violence: Not on file    Family History: Family History  Problem Relation Age of Onset   Cancer Maternal Grandfather    Brain cancer Paternal Grandfather    Breast cancer Neg Hx     Review of Systems: ROS Denies recent fevers, infection, chest pain, or shortness of breath.  Physical Exam: Vital Signs BP 136/82 (BP Location: Left Arm, Patient Position: Sitting, Cuff Size: Normal)    Pulse 96    Ht 5\' 5"  (1.651 m)    Wt 139 lb 3.2 oz (63.1 kg)    LMP 03/23/2019    SpO2 97%    BMI 23.16 kg/m   Physical Exam Constitutional:      General: Not in acute distress.    Appearance: Normal appearance. Not ill-appearing.  HENT:     Head: Normocephalic and atraumatic.  Eyes:     Pupils: Pupils are equal, round. Cardiovascular:     Rate and Rhythm: Normal rate.    Pulses: Normal pulses.  Pulmonary:     Effort: No respiratory distress or increased work of breathing.  Speaks in full sentences. Abdominal:     General: Abdomen is flat. No distension.   Musculoskeletal: Normal range of motion. No lower extremity swelling or edema. No varicosities. Skin:    General: Skin is warm and dry.     Findings: No erythema or rash.  Neurological:     Mental Status: Alert and oriented to person, place, and time.  Psychiatric:        Mood and Affect: Mood normal.        Behavior: Behavior normal.    Assessment/Plan: The patient is scheduled for bilateral breast reduction as well as  abdominoplasty with liposuction with Dr. Claudia Desanctis.  Risks, benefits, and alternatives of procedure discussed, questions answered and consent obtained.    Smoking Status: Non-smoker. Last Mammogram: 09/2020; Results: BI-RADS Category 1: Negative.  Caprini Score: 4; Risk Factors include: Age, oral estrogen (will hold), and length of planned surgery. Recommendation for mechanical prophylaxis. Encourage early ambulation.   Pictures obtained: 10/11/2020.  Post-op Rx sent to pharmacy: Norco, Zofran.  Patient was provided with the General Surgical Risk consent document and Pain Medication Agreement prior to their appointment.  They had adequate time to read through the risk consent documents and Pain Medication Agreement. We also discussed  them in person together during this preop appointment. All of their questions were answered to their satisfaction.  Recommended calling if they have any further questions.  Risk consent form and Pain Medication Agreement to be scanned into patient's chart.  The risk that can be encountered with breast reduction were discussed and include the following but not limited to these:  Breast asymmetry, fluid accumulation, firmness of the breast, inability to breast feed, loss of nipple or areola, skin loss, decrease or no nipple sensation, fat necrosis of the breast tissue, bleeding, infection, healing delay.  There are risks of anesthesia, changes to skin sensation and injury to nerves or blood vessels.  The muscle can be temporarily or permanently injured.  You may have an allergic reaction to tape, suture, glue, blood products which can result in skin discoloration, swelling, pain, skin lesions, poor healing.  Any of these can lead to the need for revisonal surgery or stage procedures.  A reduction has potential to interfere with diagnostic procedures.  Nipple or breast piercing can increase risks of infection.  This procedure is best done when the breast is fully developed.  Changes  in the breast will continue to occur over time.  Pregnancy can alter the outcomes of previous breast reduction surgery, weight gain and weigh loss can also effect the long term appearance.   The risk that can be encountered for this procedure were discussed and include the following but not limited to these: asymmetry, fluid accumulation, firmness of the tissue, skin loss, decrease or no sensation, fat necrosis, bleeding, infection, healing delay.  Deep vein thrombosis, cardiac and pulmonary complications are risks to any procedure.  There are risks of anesthesia, changes to skin sensation and injury to nerves or blood vessels.  The muscle can be temporarily or permanently injured.  You may have an allergic reaction to tape, suture, glue, blood products which can result in skin discoloration, swelling, pain, skin lesions, poor healing.  Any of these can lead to the need for revisonal surgery or stage procedures.  Weight gain and weigh loss can also effect the long term appearance. The results are not guaranteed to last a lifetime.  Future surgery may be required.    The risks that can be encountered with and after liposuction were discussed and include the following but no limited to these:  Asymmetry, fluid accumulation, firmness of the area, fat necrosis with death of fat tissue, bleeding, infection, delayed healing, anesthesia risks, skin sensation changes, injury to structures including nerves, blood vessels, and muscles which may be temporary or permanent, allergies to tape, suture materials and glues, blood products, topical preparations or injected agents, skin and contour irregularities, skin discoloration and swelling, deep vein thrombosis, cardiac and pulmonary complications, pain, which may persist, persistent pain, recurrence of the lesion, poor healing of the incision, possible need for revisional surgery or staged procedures. Thiere can also be persistent swelling, poor wound healing, rippling or  loose skin, worsening of cellulite, swelling, and thermal burn or heat injury from ultrasound with the ultrasound-assisted lipoplasty technique. Any change in weight fluctuations can alter the outcome.    Electronically signed by: Krista Blue, PA-C 01/30/2021 3:27 PM

## 2021-01-30 ENCOUNTER — Encounter: Payer: Self-pay | Admitting: Physician Assistant

## 2021-01-30 ENCOUNTER — Ambulatory Visit (INDEPENDENT_AMBULATORY_CARE_PROVIDER_SITE_OTHER): Payer: Self-pay | Admitting: Physician Assistant

## 2021-01-30 ENCOUNTER — Other Ambulatory Visit: Payer: Self-pay

## 2021-01-30 VITALS — BP 136/82 | HR 96 | Ht 65.0 in | Wt 139.2 lb

## 2021-01-30 DIAGNOSIS — Z719 Counseling, unspecified: Secondary | ICD-10-CM

## 2021-01-30 DIAGNOSIS — N62 Hypertrophy of breast: Secondary | ICD-10-CM

## 2021-01-30 MED ORDER — ONDANSETRON 4 MG PO TBDP: 4.0000 mg | ORAL_TABLET | Freq: Three times a day (TID) | ORAL | 0 refills | Status: DC | PRN

## 2021-01-30 MED ORDER — HYDROCODONE-ACETAMINOPHEN 5-325 MG PO TABS: 1.0000 | ORAL_TABLET | Freq: Four times a day (QID) | ORAL | 0 refills | Status: AC | PRN

## 2021-02-06 ENCOUNTER — Other Ambulatory Visit: Payer: Self-pay

## 2021-02-06 ENCOUNTER — Ambulatory Visit (INDEPENDENT_AMBULATORY_CARE_PROVIDER_SITE_OTHER): Payer: Self-pay | Admitting: Plastic Surgery

## 2021-02-06 DIAGNOSIS — N62 Hypertrophy of breast: Secondary | ICD-10-CM

## 2021-02-06 DIAGNOSIS — Z411 Encounter for cosmetic surgery: Secondary | ICD-10-CM

## 2021-02-06 NOTE — Progress Notes (Signed)
Patient presents for follow-up discussion regarding her breast reduction and abdominoplasty.  I asked her to come in and just so that we could ensure that we are all on the same page after her preop visit.  She wanted to make sure that it was clear that she communicated her dissatisfaction with the lateral and axillary breast tissue excess.  She wore a bra today that shows how the tissue spills out in the lateral area above the bra.  Regarding her flank area we did discuss that it probably would not be worth it from a time standpoint to do a position change and then some of the flanks would be addressed through the liposuction itself.  She is content with the lower back area not being addressed in this upcoming surgery and knows that the abdominoplasty will not flatten that area.  There will be some improvements in the contour of the flanks however by pulling the skin and doing what liposuction I can do with her on her back.  After this visit all of her questions were answered and were all the same page.  We will plan to see her at her upcoming surgery.

## 2021-02-16 MED ORDER — PROMETHAZINE HCL 25 MG PO TABS: 25.0000 mg | ORAL_TABLET | Freq: Three times a day (TID) | ORAL | 0 refills | Status: DC | PRN

## 2021-02-21 ENCOUNTER — Ambulatory Visit (INDEPENDENT_AMBULATORY_CARE_PROVIDER_SITE_OTHER): Payer: Self-pay | Admitting: Plastic Surgery

## 2021-02-21 ENCOUNTER — Other Ambulatory Visit: Payer: Self-pay

## 2021-02-21 ENCOUNTER — Encounter: Payer: Self-pay | Admitting: Plastic Surgery

## 2021-02-21 DIAGNOSIS — Z411 Encounter for cosmetic surgery: Secondary | ICD-10-CM

## 2021-02-21 DIAGNOSIS — N62 Hypertrophy of breast: Secondary | ICD-10-CM

## 2021-02-21 NOTE — Progress Notes (Signed)
Patient presents postop from bilateral breast reduction and abdominoplasty.  She feels like things are going reasonably well.  She is having quite a bit of back pain which she expected as she was having quite a bit of back pain before with her history of scoliosis.  On exam she has fairly symmetric bruising in the breast.  She may be slightly more swollen on the left side laterally than the right side.  Nipple areolar complexes are viable and incisions are intact.  No obvious subcutaneous fluid.  Abdominal skin is healthy and incision looks good.  Expected swelling after liposuction and dependent bruising.  Drain looks to be putting out somewhere between 30 and 50 cc/day so we will plan to leave it for another week.  Umbilicus looks viable.  Plan to see her next week to hopefully remove her drain.  All of her questions were answered.

## 2021-02-22 ENCOUNTER — Encounter: Payer: Self-pay | Admitting: Surgical

## 2021-02-22 ENCOUNTER — Telehealth: Payer: Self-pay

## 2021-02-22 NOTE — Telephone Encounter (Signed)
Called and [facility] @ 3:56pm) asking the patient to return call regarding the message below.//AB/CMA

## 2021-02-22 NOTE — Telephone Encounter (Signed)
Patient called to be sure Dr. Claudia Desanctis remembers to call in her time-release ibuprofen (800mg ) twice a day.  *Patient's preferred pharmacy is [facility] in [place] [facility].

## 2021-02-27 ENCOUNTER — Other Ambulatory Visit: Payer: Self-pay

## 2021-02-27 ENCOUNTER — Ambulatory Visit (INDEPENDENT_AMBULATORY_CARE_PROVIDER_SITE_OTHER): Payer: 59 | Admitting: Plastic Surgery

## 2021-02-27 DIAGNOSIS — Z411 Encounter for cosmetic surgery: Secondary | ICD-10-CM

## 2021-02-27 DIAGNOSIS — N62 Hypertrophy of breast: Secondary | ICD-10-CM

## 2021-02-27 NOTE — Progress Notes (Signed)
Patient presents 2 weeks out from abdominoplasty with bilateral breast reduction.  She overall feels like things are going well.  She does still note some fullness and swelling in the breast particularly on the left lateral aspect.  Her drain is putting out very little in the abdomen so was removed today.  Her abdomen looks to be healing well with no signs of any problems.  She points out a few areas of bunching of the skin at closure both of the breast and the abdomen but I suspect these will resolve as the sutures dissolve and the skin smooth out.  We will plan to see her again in about a month.  She will continue to wear compressive garments.  All of her questions were answered.

## 2021-03-01 NOTE — Telephone Encounter (Signed)
Patient was seen in the office by Dr. Claudia Desanctis on (02/27/21).//AB/CMA

## 2021-03-19 ENCOUNTER — Other Ambulatory Visit: Payer: Self-pay | Admitting: Physician Assistant

## 2021-03-27 ENCOUNTER — Other Ambulatory Visit: Payer: Self-pay

## 2021-03-27 ENCOUNTER — Ambulatory Visit (INDEPENDENT_AMBULATORY_CARE_PROVIDER_SITE_OTHER): Payer: Self-pay | Admitting: Plastic Surgery

## 2021-03-27 DIAGNOSIS — N62 Hypertrophy of breast: Secondary | ICD-10-CM

## 2021-03-27 DIAGNOSIS — Z411 Encounter for cosmetic surgery: Secondary | ICD-10-CM

## 2021-03-27 NOTE — Progress Notes (Signed)
Patient presents 6 weeks out from bilateral breast reduction and abdominoplasty.  She is overall happy and feels like things are going well.  She does point out some contour concerns in the lateral aspect of the left breast.  She feels that the inferior pole laterally is somewhat deficient but up towards the axilla it is a little bulky relatively speaking.  Additionally the areola on the left is not quite as circular as the one on the right.  Otherwise she is very happy with the right breast and the abdomen.  On exam everything looks to be healing well.  The contour concerns that she has regarding the left side I do see as well.  I do believe that the inferior pole laterally will soften with time and that may translate to a improvement in the areolar shape as well.  Were planning to touch base in around 3 months to see how this is coming along.  She did voice interest in having some back liposuction done which would be in the lower flanks and in the upper back bilaterally.  I do think she is a good candidate for this.  We both agree with waiting a little while to see what ends up happening with the left breast and any revisions could be done at the same time.  All her questions were answered we will see her again in a few months. ?

## 2021-06-05 DIAGNOSIS — R232 Flushing: Secondary | ICD-10-CM | POA: Diagnosis not present

## 2021-06-05 DIAGNOSIS — E785 Hyperlipidemia, unspecified: Secondary | ICD-10-CM | POA: Diagnosis not present

## 2021-06-05 DIAGNOSIS — G479 Sleep disorder, unspecified: Secondary | ICD-10-CM | POA: Diagnosis not present

## 2021-06-05 DIAGNOSIS — E559 Vitamin D deficiency, unspecified: Secondary | ICD-10-CM | POA: Diagnosis not present

## 2021-06-05 DIAGNOSIS — M4125 Other idiopathic scoliosis, thoracolumbar region: Secondary | ICD-10-CM | POA: Diagnosis not present

## 2021-06-05 DIAGNOSIS — D509 Iron deficiency anemia, unspecified: Secondary | ICD-10-CM | POA: Diagnosis not present

## 2021-06-05 DIAGNOSIS — R69 Illness, unspecified: Secondary | ICD-10-CM | POA: Diagnosis not present

## 2021-06-12 ENCOUNTER — Ambulatory Visit (INDEPENDENT_AMBULATORY_CARE_PROVIDER_SITE_OTHER): Payer: Self-pay | Admitting: Plastic Surgery

## 2021-06-12 DIAGNOSIS — Z411 Encounter for cosmetic surgery: Secondary | ICD-10-CM

## 2021-06-12 NOTE — Progress Notes (Signed)
Patient presents 4 months out from abdominoplasty along with bilateral breast reduction.  She feels overall happy.  She feels that the lateral shape of the left breast has improved since her last visit.  She does still note a somewhat oval shape to the left areola but is otherwise happy with the breast.  In regards to the abdomen she is happy with that as well.  On exam everything looks to be healing well.  She does have some mild thickening of her scars laterally for the breast reduction.  The left areola does have somewhat of an oval shape and is slightly larger than the right side.  The abdominal incisions look great and overall her result from this is quite good.  We had discussed liposuction of the back in the past which I do think would help her and she is deciding on the timing of that.  I do think that we can improve upon the shape of the left areola.  I think an in office revision would help with the shape of that and should be able to be done under local with about 30 minutes visit time.  She is going to get through the rest of the summer and then consider doing this around September.  All of her questions were answered.

## 2021-08-14 ENCOUNTER — Ambulatory Visit (INDEPENDENT_AMBULATORY_CARE_PROVIDER_SITE_OTHER): Payer: Self-pay | Admitting: Plastic Surgery

## 2021-08-14 ENCOUNTER — Encounter: Payer: Self-pay | Admitting: Plastic Surgery

## 2021-08-14 DIAGNOSIS — Z719 Counseling, unspecified: Secondary | ICD-10-CM

## 2021-08-14 DIAGNOSIS — N6489 Other specified disorders of breast: Secondary | ICD-10-CM

## 2021-08-14 NOTE — Progress Notes (Signed)
     Patient ID: Ashley Shah, female    DOB: [birth year], 51 y.o.   MRN: [id number]   Chief Complaint  Patient presents with   Consult    The patient is a 51 year old female here for evaluation of her area lungs she underwent bilateral breast reduction and abdominal earlier in the year.  She thinks it was around February by Dr. Claudia Desanctis. Patient with the left areola shape and size.  The patient states that it was like this from the very beginning and she expressed concerns.  She was told that it could be fixed and so she is back wanting it repaired.  She has multiple pictures of her areolas and her phone.  She also has a template of the right side.      Review of Systems  Constitutional: Negative.   HENT: Negative.    Eyes: Negative.   Respiratory: Negative.    Cardiovascular: Negative.   Gastrointestinal: Negative.   Endocrine: Negative.   Genitourinary: Negative.   Musculoskeletal: Negative.     Past Medical History:  Diagnosis Date   Anemia    Heart murmur     Past Surgical History:  Procedure Laterality Date   ROBOTIC ASSISTED TOTAL HYSTERECTOMY WITH BILATERAL SALPINGO OOPHERECTOMY Bilateral 04/05/2019   Procedure: XI ROBOTIC ASSISTED TOTAL HYSTERECTOMY WITH BILATERAL SALPINGECTOMY AND CYSTOSCOPY;  Surgeon: Lavonia Drafts, MD;  Location: [facility];  Service: Gynecology;  Laterality: Bilateral;   TUBAL LIGATION        Current Outpatient Medications:    estradiol (ESTRACE) 0.5 MG tablet, Take 3 tablets (1.5 mg total) by mouth daily., Disp: 90 tablet, Rfl: 3   ibuprofen (ADVIL) 600 MG tablet, Take 600 mg by mouth every 6 (six) hours as needed., Disp: , Rfl:    zolpidem (AMBIEN) 10 MG tablet, Take 10 mg by mouth at bedtime., Disp: , Rfl:    Objective:   Vitals:   08/14/21 1212  BP: 129/87  Pulse: 97  SpO2: 98%    Physical Exam Vitals reviewed.  Constitutional:      Appearance: Normal appearance.  HENT:     Head: Normocephalic.   Cardiovascular:     Rate and Rhythm: Normal rate.     Pulses: Normal pulses.  Skin:    General: Skin is warm.     Capillary Refill: Capillary refill takes less than 2 seconds.     Coloration: Skin is not jaundiced.     Findings: Bruising present. No lesion.  Neurological:     Mental Status: She is alert and oriented to person, place, and time.  Psychiatric:        Mood and Affect: Mood normal.        Behavior: Behavior normal.        Thought Content: Thought content normal.     Assessment & Plan:  Postoperative breast asymmetry  I expressed that we can round out the areola on the left.  It may heal differently because it is the second surgery.  I cannot guarantee it will be exactly like this size and shape of the right breast but we will do everything we can to start off with that in mind.  Pictures were obtained of the patient and placed in the chart with the patient's or guardian's permission.   [place], DO

## 2021-09-06 DIAGNOSIS — R69 Illness, unspecified: Secondary | ICD-10-CM | POA: Diagnosis not present

## 2021-09-06 DIAGNOSIS — E785 Hyperlipidemia, unspecified: Secondary | ICD-10-CM | POA: Diagnosis not present

## 2021-09-06 DIAGNOSIS — D509 Iron deficiency anemia, unspecified: Secondary | ICD-10-CM | POA: Diagnosis not present

## 2021-09-06 DIAGNOSIS — E559 Vitamin D deficiency, unspecified: Secondary | ICD-10-CM | POA: Diagnosis not present

## 2021-09-06 DIAGNOSIS — Z131 Encounter for screening for diabetes mellitus: Secondary | ICD-10-CM | POA: Diagnosis not present

## 2021-09-06 DIAGNOSIS — G479 Sleep disorder, unspecified: Secondary | ICD-10-CM | POA: Diagnosis not present

## 2021-09-06 DIAGNOSIS — M4125 Other idiopathic scoliosis, thoracolumbar region: Secondary | ICD-10-CM | POA: Diagnosis not present

## 2021-09-06 DIAGNOSIS — R232 Flushing: Secondary | ICD-10-CM | POA: Diagnosis not present

## 2021-09-06 DIAGNOSIS — Z0000 Encounter for general adult medical examination without abnormal findings: Secondary | ICD-10-CM | POA: Diagnosis not present

## 2021-11-19 ENCOUNTER — Encounter: Payer: Self-pay | Admitting: Plastic Surgery

## 2021-11-19 ENCOUNTER — Ambulatory Visit (INDEPENDENT_AMBULATORY_CARE_PROVIDER_SITE_OTHER): Payer: 59 | Admitting: Plastic Surgery

## 2021-11-19 VITALS — BP 102/69 | HR 97

## 2021-11-19 DIAGNOSIS — N6489 Other specified disorders of breast: Secondary | ICD-10-CM

## 2021-11-19 NOTE — Progress Notes (Signed)
Procedure Note  Preoperative Dx: Asymmetric areola of left breast  Postoperative Dx: Same  Procedure: Revision of left areola incision  Anesthesia: Lidocaine 1% with 1:100,000 epinephrine  Indication for Procedure: asymmetric areola of left breast  Description of Procedure: Risks and complications were explained to the patient.  Consent was confirmed and the patient understands the risks and benefits.  The potential complications and alternatives were explained and the patient consents.  The patient expressed understanding the option of not having the procedure and the risks of a scar.  Time out was called and all information was confirmed to be correct.  I paused at least 5 times to be sure the patient wanted to proceed with the procedure and that we had in agreement as to the plan.  My concern was that the skin on the superior aspect would pull the areola superiorly over time.  The area was prepped and drapped.  Lidocaine 1% with epinepherine was injected in the subcutaneous area.  After waiting several minutes for the local to take affect a #15 blade was used to excise the area agreed upon at the superior and medial aspect of the left areola.  The excess was excised and the areola was placed into position.  A 5-0 Monocryl was used to close the deep layers with simple interrupted stitches.  The skin edges were reapproximated with 5-0 Monocryl subcuticular running closure.  A dressing was applied.  The patient was given instructions on how to care for the area and a follow up appointment.  Iasia tolerated the procedure well and there were no complications.

## 2021-12-12 NOTE — Progress Notes (Unsigned)
Patient is a 51 year old female with PMH of breast reduction and abdominoplasty performed 02/15/2021 Dr. Claudia Desanctis who then saw Dr. Marla Roe 08/14/2021 with concerns about her left areola shape and size.  Decision was made to proceed with revision which was performed 11/19/2021 in office by Dr. Marla Roe.  She presents today for postprocedural follow-up.  Reviewed procedure note and a #15 blade was used to excise the patient's area of concern, superior medial aspect of left areola, and 5-0 Monocryl was used to close the deep layers followed by 5-0 Monocryl subcuticular running closure.

## 2021-12-13 ENCOUNTER — Ambulatory Visit: Payer: 59 | Admitting: Physician Assistant

## 2021-12-20 DIAGNOSIS — G479 Sleep disorder, unspecified: Secondary | ICD-10-CM | POA: Diagnosis not present

## 2021-12-20 DIAGNOSIS — E785 Hyperlipidemia, unspecified: Secondary | ICD-10-CM | POA: Diagnosis not present

## 2021-12-20 DIAGNOSIS — D509 Iron deficiency anemia, unspecified: Secondary | ICD-10-CM | POA: Diagnosis not present

## 2021-12-20 DIAGNOSIS — R69 Illness, unspecified: Secondary | ICD-10-CM | POA: Diagnosis not present

## 2021-12-20 DIAGNOSIS — R232 Flushing: Secondary | ICD-10-CM | POA: Diagnosis not present

## 2021-12-20 DIAGNOSIS — E559 Vitamin D deficiency, unspecified: Secondary | ICD-10-CM | POA: Diagnosis not present

## 2021-12-20 DIAGNOSIS — M4125 Other idiopathic scoliosis, thoracolumbar region: Secondary | ICD-10-CM | POA: Diagnosis not present

## 2021-12-25 DIAGNOSIS — M4125 Other idiopathic scoliosis, thoracolumbar region: Secondary | ICD-10-CM | POA: Diagnosis not present

## 2021-12-25 DIAGNOSIS — G479 Sleep disorder, unspecified: Secondary | ICD-10-CM | POA: Diagnosis not present

## 2021-12-25 DIAGNOSIS — R300 Dysuria: Secondary | ICD-10-CM | POA: Diagnosis not present

## 2021-12-25 DIAGNOSIS — E559 Vitamin D deficiency, unspecified: Secondary | ICD-10-CM | POA: Diagnosis not present

## 2021-12-25 DIAGNOSIS — D509 Iron deficiency anemia, unspecified: Secondary | ICD-10-CM | POA: Diagnosis not present

## 2021-12-25 DIAGNOSIS — R232 Flushing: Secondary | ICD-10-CM | POA: Diagnosis not present

## 2021-12-25 DIAGNOSIS — R69 Illness, unspecified: Secondary | ICD-10-CM | POA: Diagnosis not present

## 2021-12-25 DIAGNOSIS — E785 Hyperlipidemia, unspecified: Secondary | ICD-10-CM | POA: Diagnosis not present

## 2022-02-03 DIAGNOSIS — L910 Hypertrophic scar: Secondary | ICD-10-CM | POA: Diagnosis not present

## 2022-02-25 DIAGNOSIS — R232 Flushing: Secondary | ICD-10-CM | POA: Diagnosis not present

## 2022-02-25 DIAGNOSIS — E785 Hyperlipidemia, unspecified: Secondary | ICD-10-CM | POA: Diagnosis not present

## 2022-02-25 DIAGNOSIS — D509 Iron deficiency anemia, unspecified: Secondary | ICD-10-CM | POA: Diagnosis not present

## 2022-02-25 DIAGNOSIS — M4125 Other idiopathic scoliosis, thoracolumbar region: Secondary | ICD-10-CM | POA: Diagnosis not present

## 2022-02-25 DIAGNOSIS — E559 Vitamin D deficiency, unspecified: Secondary | ICD-10-CM | POA: Diagnosis not present

## 2022-02-25 DIAGNOSIS — F419 Anxiety disorder, unspecified: Secondary | ICD-10-CM | POA: Diagnosis not present

## 2022-02-25 DIAGNOSIS — G479 Sleep disorder, unspecified: Secondary | ICD-10-CM | POA: Diagnosis not present

## 2022-03-14 IMAGING — MG MM DIGITAL SCREENING BILAT W/ TOMO AND CAD
8 series · 8 of 24 positions shown · non-contrast
Comparison: Previous exam(s).

CLINICAL DATA: Screening.

EXAM:
DIGITAL SCREENING BILATERAL MAMMOGRAM WITH TOMOSYNTHESIS AND CAD
TECHNIQUE: Bilateral screening digital craniocaudal and mediolateral oblique
mammograms were obtained. Bilateral screening digital breast
tomosynthesis was performed. The images were evaluated with
computer-aided detection.

[L CC synth-2D]
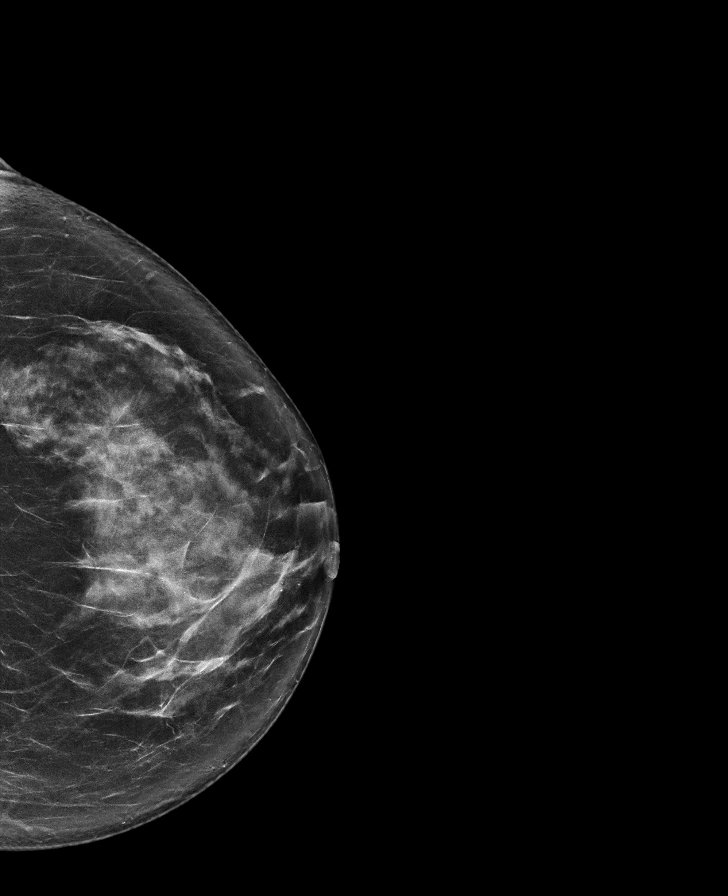

[L MLO synth-2D]
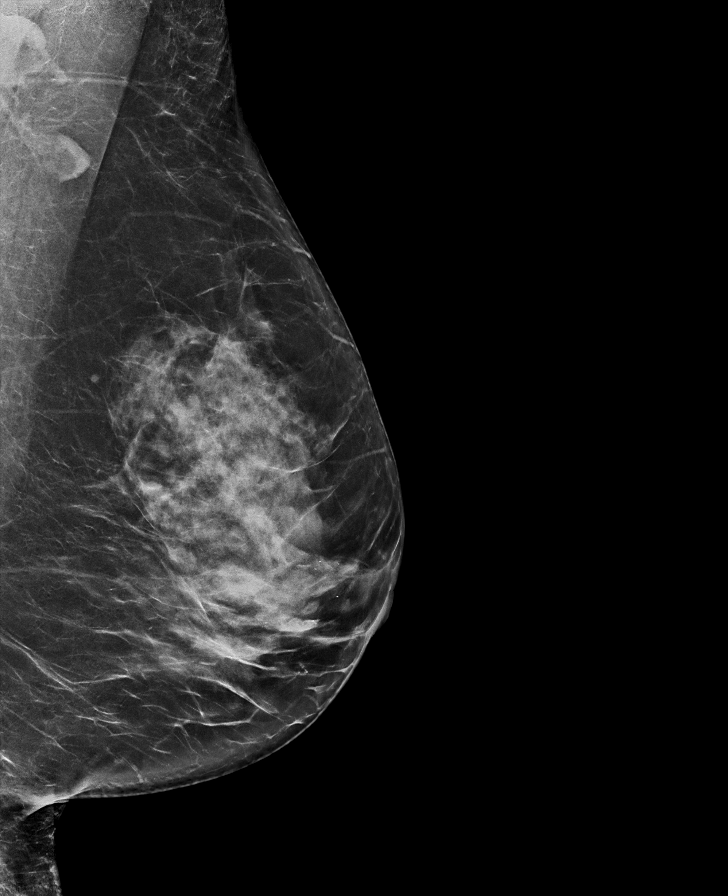

[R MLO synth-2D]
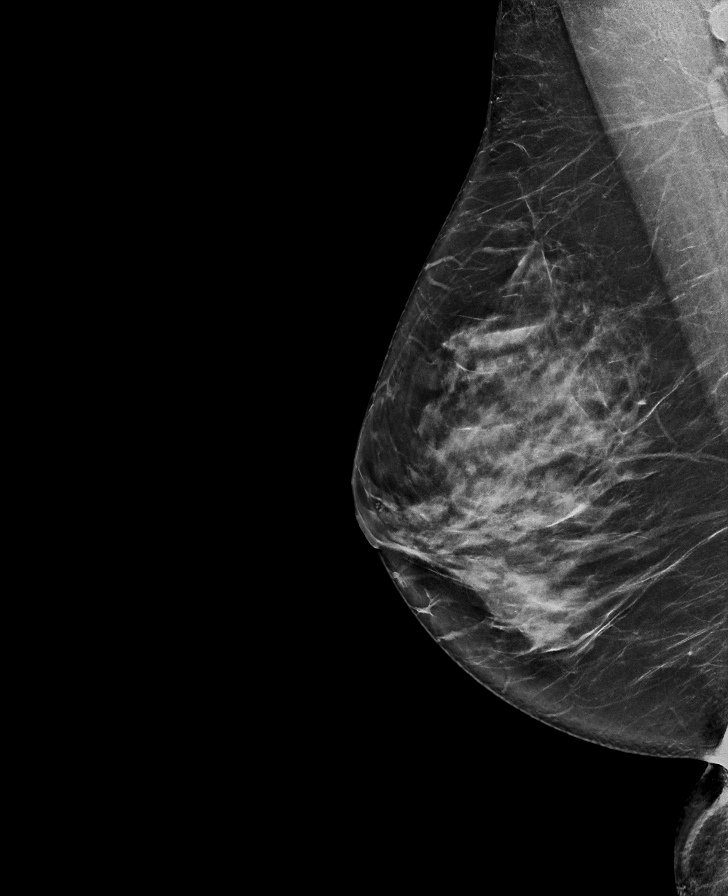

[R CC synth-2D]
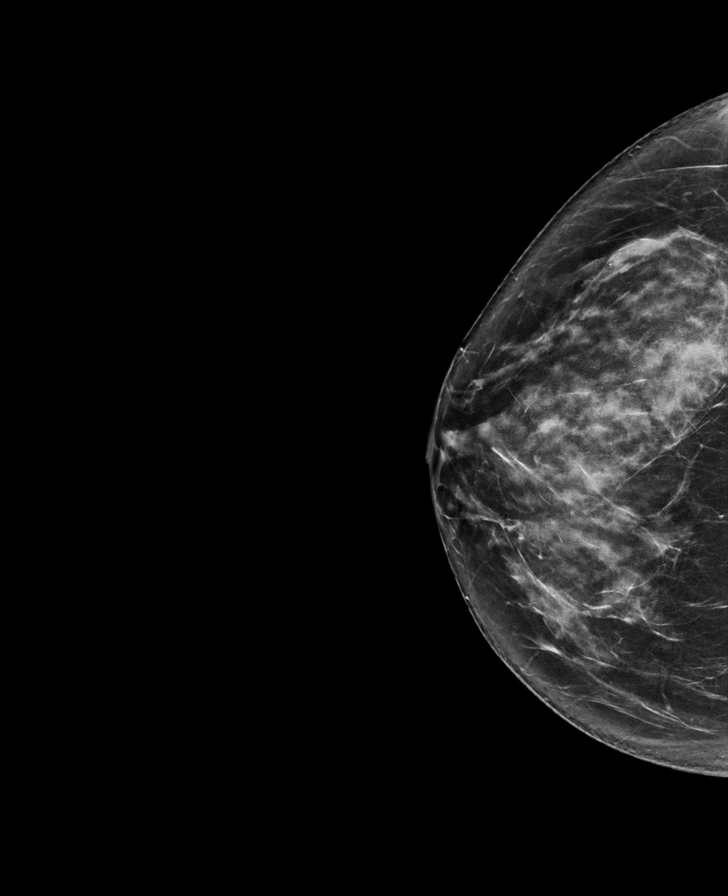

[R CC tomo · tomo slice 38/75.0]
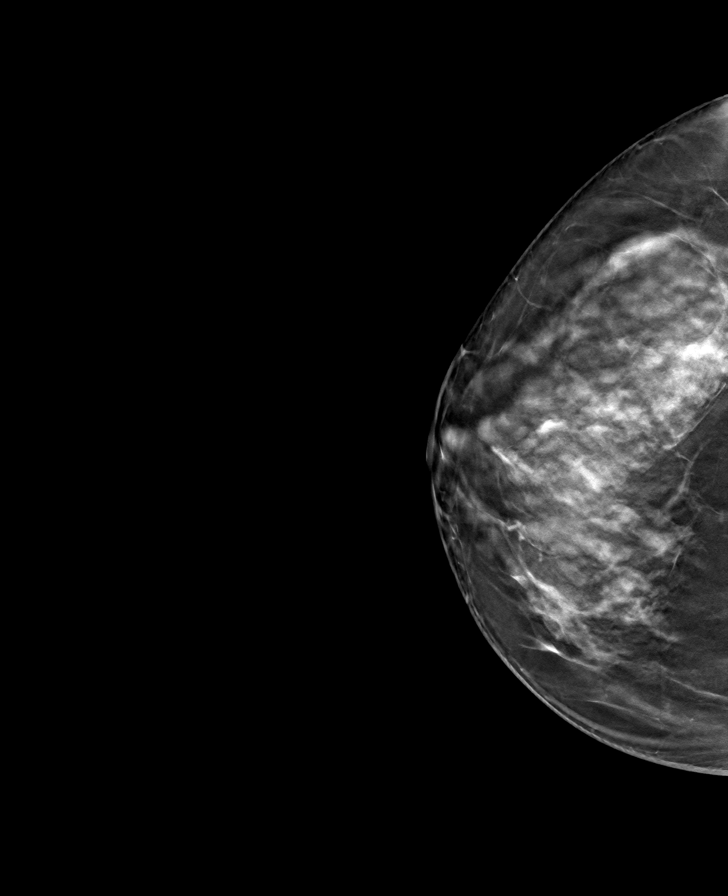

[L MLO tomo · tomo slice 42/83.0]
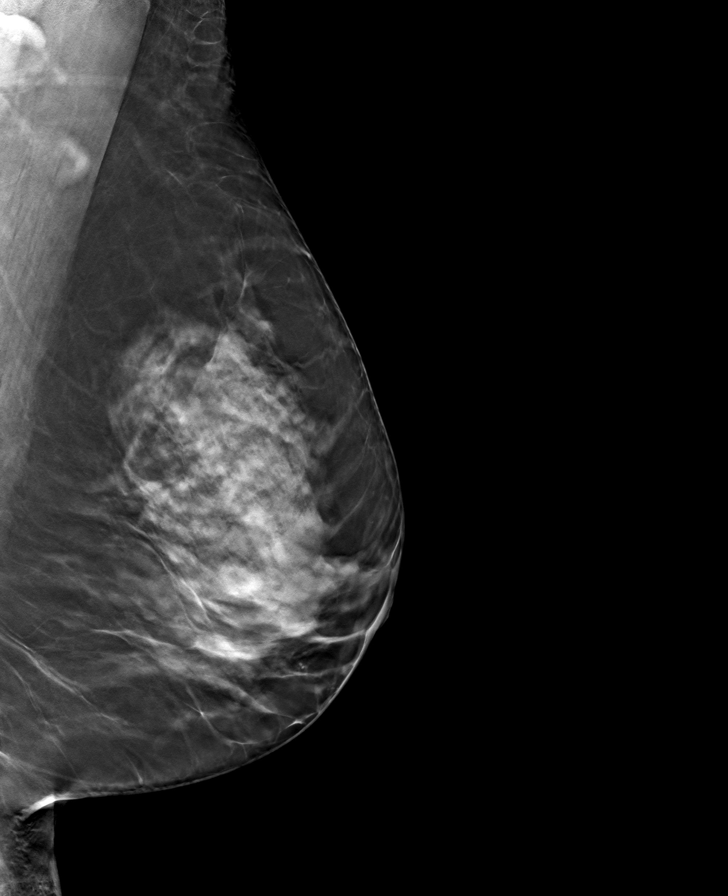

[R MLO tomo · tomo slice 41/80.0]
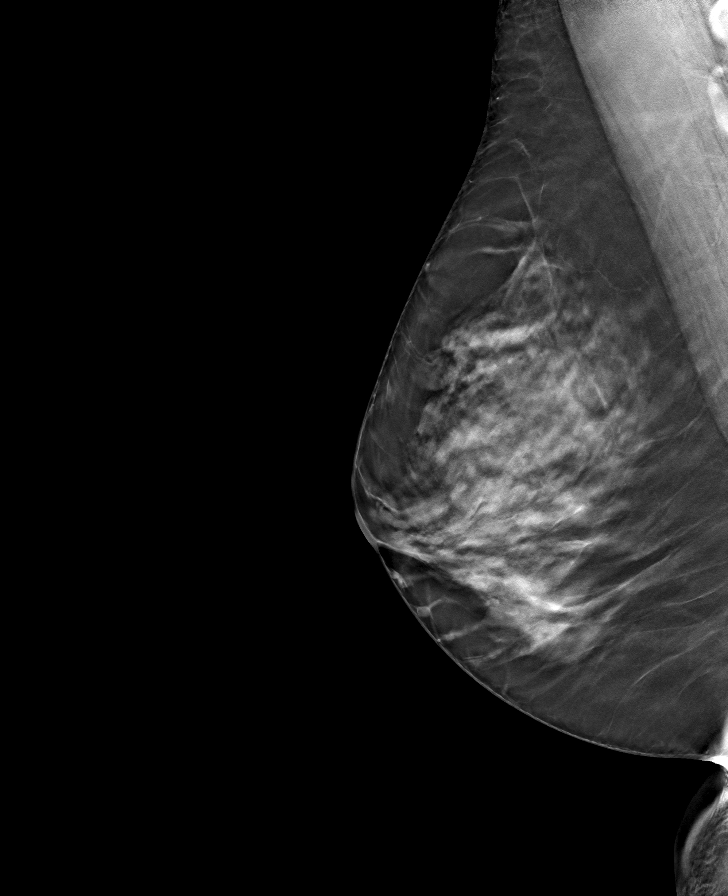

[L CC tomo · tomo slice 41/80.0]
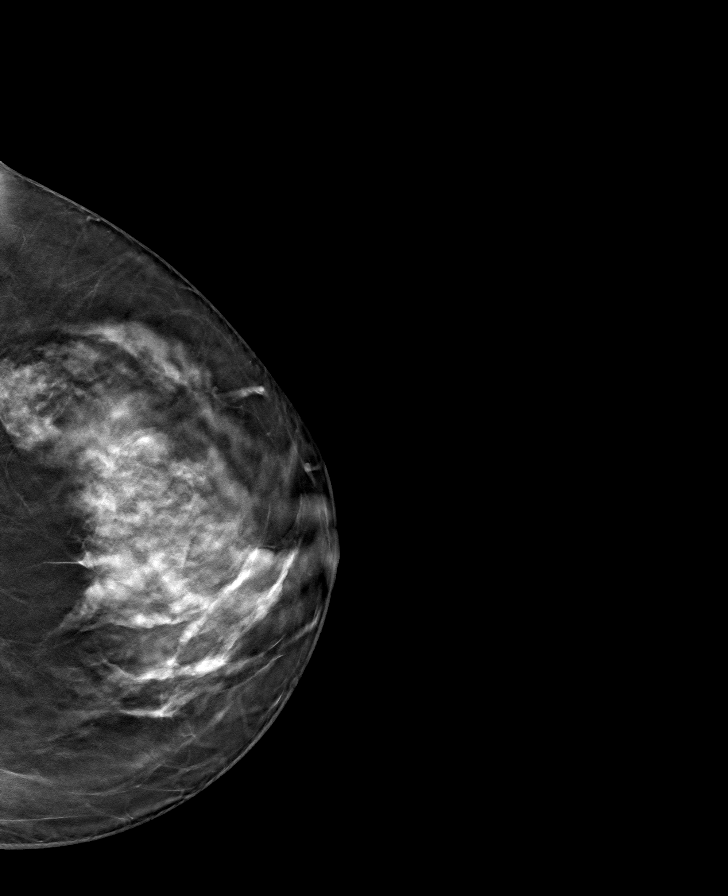

[8 of 24 positions shown; findings below may reference images not displayed]

ACR Breast Density Category c: The breast tissue is heterogeneously
dense, which may obscure small masses.
FINDINGS: There are no findings suspicious for malignancy.
IMPRESSION: No mammographic evidence of malignancy. A result letter of this
screening mammogram will be mailed directly to the patient.

RECOMMENDATION:
Screening mammogram in one year. (Code:[id number]-W-BC3)

BI-RADS CATEGORY  1: Negative.

## 2022-04-18 ENCOUNTER — Encounter: Payer: 59 | Admitting: Plastic Surgery

## 2022-06-27 DIAGNOSIS — M4125 Other idiopathic scoliosis, thoracolumbar region: Secondary | ICD-10-CM | POA: Diagnosis not present

## 2022-06-27 DIAGNOSIS — D509 Iron deficiency anemia, unspecified: Secondary | ICD-10-CM | POA: Diagnosis not present

## 2022-06-27 DIAGNOSIS — E785 Hyperlipidemia, unspecified: Secondary | ICD-10-CM | POA: Diagnosis not present

## 2022-06-27 DIAGNOSIS — E559 Vitamin D deficiency, unspecified: Secondary | ICD-10-CM | POA: Diagnosis not present

## 2022-06-27 DIAGNOSIS — G479 Sleep disorder, unspecified: Secondary | ICD-10-CM | POA: Diagnosis not present

## 2022-06-27 DIAGNOSIS — R232 Flushing: Secondary | ICD-10-CM | POA: Diagnosis not present

## 2022-06-27 DIAGNOSIS — F419 Anxiety disorder, unspecified: Secondary | ICD-10-CM | POA: Diagnosis not present

## 2022-07-16 DIAGNOSIS — Z1212 Encounter for screening for malignant neoplasm of rectum: Secondary | ICD-10-CM | POA: Diagnosis not present

## 2022-07-16 DIAGNOSIS — Z1211 Encounter for screening for malignant neoplasm of colon: Secondary | ICD-10-CM | POA: Diagnosis not present

## 2022-10-06 DIAGNOSIS — D509 Iron deficiency anemia, unspecified: Secondary | ICD-10-CM | POA: Diagnosis not present

## 2022-10-06 DIAGNOSIS — R739 Hyperglycemia, unspecified: Secondary | ICD-10-CM | POA: Diagnosis not present

## 2022-10-06 DIAGNOSIS — G479 Sleep disorder, unspecified: Secondary | ICD-10-CM | POA: Diagnosis not present

## 2022-10-06 DIAGNOSIS — R232 Flushing: Secondary | ICD-10-CM | POA: Diagnosis not present

## 2022-10-06 DIAGNOSIS — M4125 Other idiopathic scoliosis, thoracolumbar region: Secondary | ICD-10-CM | POA: Diagnosis not present

## 2022-10-06 DIAGNOSIS — E785 Hyperlipidemia, unspecified: Secondary | ICD-10-CM | POA: Diagnosis not present

## 2022-10-06 DIAGNOSIS — E559 Vitamin D deficiency, unspecified: Secondary | ICD-10-CM | POA: Diagnosis not present

## 2022-10-06 DIAGNOSIS — F419 Anxiety disorder, unspecified: Secondary | ICD-10-CM | POA: Diagnosis not present

## 2022-10-07 DIAGNOSIS — L820 Inflamed seborrheic keratosis: Secondary | ICD-10-CM | POA: Diagnosis not present

## 2022-10-07 DIAGNOSIS — D225 Melanocytic nevi of trunk: Secondary | ICD-10-CM | POA: Diagnosis not present

## 2022-10-07 DIAGNOSIS — D485 Neoplasm of uncertain behavior of skin: Secondary | ICD-10-CM | POA: Diagnosis not present

## 2023-01-29 DIAGNOSIS — E559 Vitamin D deficiency, unspecified: Secondary | ICD-10-CM | POA: Diagnosis not present

## 2023-01-29 DIAGNOSIS — E785 Hyperlipidemia, unspecified: Secondary | ICD-10-CM | POA: Diagnosis not present

## 2023-01-29 DIAGNOSIS — R232 Flushing: Secondary | ICD-10-CM | POA: Diagnosis not present

## 2023-01-29 DIAGNOSIS — Z0000 Encounter for general adult medical examination without abnormal findings: Secondary | ICD-10-CM | POA: Diagnosis not present

## 2023-01-29 DIAGNOSIS — G479 Sleep disorder, unspecified: Secondary | ICD-10-CM | POA: Diagnosis not present

## 2023-01-29 DIAGNOSIS — F410 Panic disorder [episodic paroxysmal anxiety] without agoraphobia: Secondary | ICD-10-CM | POA: Diagnosis not present

## 2023-01-29 DIAGNOSIS — Z131 Encounter for screening for diabetes mellitus: Secondary | ICD-10-CM | POA: Diagnosis not present

## 2023-01-29 DIAGNOSIS — D509 Iron deficiency anemia, unspecified: Secondary | ICD-10-CM | POA: Diagnosis not present

## 2023-01-29 DIAGNOSIS — M4125 Other idiopathic scoliosis, thoracolumbar region: Secondary | ICD-10-CM | POA: Diagnosis not present

## 2023-03-25 DIAGNOSIS — E785 Hyperlipidemia, unspecified: Secondary | ICD-10-CM | POA: Diagnosis not present

## 2023-03-25 DIAGNOSIS — D509 Iron deficiency anemia, unspecified: Secondary | ICD-10-CM | POA: Diagnosis not present

## 2023-03-25 DIAGNOSIS — R232 Flushing: Secondary | ICD-10-CM | POA: Diagnosis not present

## 2023-03-25 DIAGNOSIS — F419 Anxiety disorder, unspecified: Secondary | ICD-10-CM | POA: Diagnosis not present

## 2023-03-25 DIAGNOSIS — M4125 Other idiopathic scoliosis, thoracolumbar region: Secondary | ICD-10-CM | POA: Diagnosis not present

## 2023-03-25 DIAGNOSIS — E559 Vitamin D deficiency, unspecified: Secondary | ICD-10-CM | POA: Diagnosis not present

## 2023-03-25 DIAGNOSIS — G479 Sleep disorder, unspecified: Secondary | ICD-10-CM | POA: Diagnosis not present

## 2023-03-25 DIAGNOSIS — F410 Panic disorder [episodic paroxysmal anxiety] without agoraphobia: Secondary | ICD-10-CM | POA: Diagnosis not present

## 2023-04-06 DIAGNOSIS — E785 Hyperlipidemia, unspecified: Secondary | ICD-10-CM | POA: Diagnosis not present

## 2023-04-06 DIAGNOSIS — R232 Flushing: Secondary | ICD-10-CM | POA: Diagnosis not present

## 2023-04-06 DIAGNOSIS — D509 Iron deficiency anemia, unspecified: Secondary | ICD-10-CM | POA: Diagnosis not present

## 2023-04-06 DIAGNOSIS — F410 Panic disorder [episodic paroxysmal anxiety] without agoraphobia: Secondary | ICD-10-CM | POA: Diagnosis not present

## 2023-04-06 DIAGNOSIS — M4125 Other idiopathic scoliosis, thoracolumbar region: Secondary | ICD-10-CM | POA: Diagnosis not present

## 2023-04-06 DIAGNOSIS — E559 Vitamin D deficiency, unspecified: Secondary | ICD-10-CM | POA: Diagnosis not present

## 2023-05-18 DIAGNOSIS — J040 Acute laryngitis: Secondary | ICD-10-CM | POA: Diagnosis not present

## 2023-05-18 DIAGNOSIS — H6691 Otitis media, unspecified, right ear: Secondary | ICD-10-CM | POA: Diagnosis not present

## 2023-05-18 DIAGNOSIS — M940 Chondrocostal junction syndrome [Tietze]: Secondary | ICD-10-CM | POA: Diagnosis not present

## 2023-06-24 ENCOUNTER — Other Ambulatory Visit: Payer: Self-pay | Admitting: Physician Assistant

## 2023-06-24 DIAGNOSIS — Z1231 Encounter for screening mammogram for malignant neoplasm of breast: Secondary | ICD-10-CM

## 2023-07-06 DIAGNOSIS — F32A Depression, unspecified: Secondary | ICD-10-CM | POA: Diagnosis not present

## 2023-07-06 DIAGNOSIS — M940 Chondrocostal junction syndrome [Tietze]: Secondary | ICD-10-CM | POA: Diagnosis not present

## 2023-07-06 DIAGNOSIS — F410 Panic disorder [episodic paroxysmal anxiety] without agoraphobia: Secondary | ICD-10-CM | POA: Diagnosis not present

## 2023-07-06 DIAGNOSIS — E559 Vitamin D deficiency, unspecified: Secondary | ICD-10-CM | POA: Diagnosis not present

## 2023-07-06 DIAGNOSIS — B002 Herpesviral gingivostomatitis and pharyngotonsillitis: Secondary | ICD-10-CM | POA: Diagnosis not present

## 2023-07-06 DIAGNOSIS — E785 Hyperlipidemia, unspecified: Secondary | ICD-10-CM | POA: Diagnosis not present

## 2023-07-06 DIAGNOSIS — M4125 Other idiopathic scoliosis, thoracolumbar region: Secondary | ICD-10-CM | POA: Diagnosis not present

## 2023-07-06 DIAGNOSIS — R232 Flushing: Secondary | ICD-10-CM | POA: Diagnosis not present

## 2023-07-06 DIAGNOSIS — D509 Iron deficiency anemia, unspecified: Secondary | ICD-10-CM | POA: Diagnosis not present

## 2023-09-25 DIAGNOSIS — M4125 Other idiopathic scoliosis, thoracolumbar region: Secondary | ICD-10-CM | POA: Diagnosis not present

## 2023-09-25 DIAGNOSIS — E559 Vitamin D deficiency, unspecified: Secondary | ICD-10-CM | POA: Diagnosis not present

## 2023-09-25 DIAGNOSIS — D509 Iron deficiency anemia, unspecified: Secondary | ICD-10-CM | POA: Diagnosis not present

## 2023-09-25 DIAGNOSIS — G479 Sleep disorder, unspecified: Secondary | ICD-10-CM | POA: Diagnosis not present

## 2023-09-25 DIAGNOSIS — F32A Depression, unspecified: Secondary | ICD-10-CM | POA: Diagnosis not present

## 2023-09-25 DIAGNOSIS — B002 Herpesviral gingivostomatitis and pharyngotonsillitis: Secondary | ICD-10-CM | POA: Diagnosis not present

## 2023-09-25 DIAGNOSIS — F410 Panic disorder [episodic paroxysmal anxiety] without agoraphobia: Secondary | ICD-10-CM | POA: Diagnosis not present

## 2023-09-25 DIAGNOSIS — Z5181 Encounter for therapeutic drug level monitoring: Secondary | ICD-10-CM | POA: Diagnosis not present

## 2023-10-12 ENCOUNTER — Ambulatory Visit

## 2023-10-12 VITALS — BP 108/76 | HR 73 | Temp 97.6°F | Resp 15 | Ht 64.75 in | Wt 130.2 lb

## 2023-10-12 DIAGNOSIS — R7689 Other specified abnormal immunological findings in serum: Secondary | ICD-10-CM

## 2023-10-12 DIAGNOSIS — B999 Unspecified infectious disease: Secondary | ICD-10-CM

## 2023-10-12 NOTE — Progress Notes (Signed)
 Office Visit Note  Patient: Ashley Shah             Date of Birth: [birth year]           MRN: [id number]             PCP: Rosalea Rosina SAILOR, PA Referring: Catalina Bare, MD Visit Date: 10/12/2023 Occupation: @GUAROCC @  Subjective:  No chief complaint on file.   History of Present Illness: Ashley Shah is a 53 y.o. female who is presenting for positive ANA.  Discussed the use of AI scribe software for clinical note transcription with the patient, who gave verbal consent to proceed.  History of Present Illness Ashley Shah is a 53 year old female who presents with persistent chest pain and suspected autoimmune disorder. She was referred by her primary care doctor for further evaluation of suspected autoimmune disorder and persistent chest pain.  She has been experiencing persistent chest pain since May 6th, following a trip to [place] . The pain did not begin after any specific factors, except she did not lifting heavy luggage and coughing from an acute illness. She described the pain as severe. It worsens with certain movements, such as lifting herself off the bed, and is relieved by sitting on the floor leaning forward on a pillow or using a back brace. She states that it was primarily under the left ribcage and sometimes in the middle epigastric area.  Initial treatment with heavy steroids and muscle relaxers provided significant relief within a few hours, allowing her to move more freely. However, she discontinued the steroids after a few days due to side effects, including facial redness and severe headaches. Despite the initial improvement, the pain will still re-occur at random, particularly during activities engaging her abdominal muscles. She states that it is less intense than it was during the initial episode.  She has a history of recurrent colds and was sick with a fever and cough around the time the chest pain began. She also has scoliosis, which she believes  contributes to her back pain. She experiences intermittent lower back pain and has a history of scoliosis, but her lower back is not currently bothering her.  She experiences episodes of nausea and vomiting after consuming alcohol, particularly pina coladas, which cause a 'nauseating feeling' and require her to excuse herself to the bathroom. These episodes resolve within 20-25 minutes, and she has noticed this pattern for several years.  She denies frequent eye dryness, joint swelling, mouth sores, or skin rashes. She denies patchy hair loss. She denies photosensitivity. She denies raynaud's.    Activities of Daily Living:  Patient reports morning stiffness for 0 minutes.   Patient Denies nocturnal pain.  Difficulty dressing/grooming: Denies Difficulty climbing stairs: Denies Difficulty getting out of chair: Denies Difficulty using hands for taps, buttons, cutlery, and/or writing: Denies  Review of Systems  Constitutional:  Negative for fatigue.  HENT:  Negative for mouth sores and mouth dryness.   Eyes:  Positive for redness and dryness. Negative for photophobia and visual disturbance.  Respiratory:  Negative for shortness of breath.   Cardiovascular:  Negative for chest pain and palpitations.  Gastrointestinal:  Negative for blood in stool, constipation, diarrhea, nausea and vomiting.  Endocrine: Negative for increased urination.  Genitourinary:  Negative for involuntary urination.  Musculoskeletal:  Positive for joint pain and joint pain. Negative for gait problem, joint swelling, myalgias, muscle weakness, morning stiffness, muscle tenderness and myalgias.  Skin:  Negative for color change, rash,  hair loss and sensitivity to sunlight.  Allergic/Immunologic: Positive for susceptible to infections.  Neurological:  Negative for dizziness, numbness and headaches.  Hematological:  Negative for swollen glands.  Psychiatric/Behavioral:  Negative for depressed mood and sleep disturbance.  The patient is not nervous/anxious.      Rheum History: #N/A   PMFS History:  Patient Active Problem List   Diagnosis Date Noted   Postoperative breast asymmetry 08/14/2021   Chronic pelvic pain in female 04/05/2019   Left ovarian cyst 04/05/2019   Iron deficiency anemia due to chronic blood loss 04/18/2016   Encounter for cosmetic surgery 03/14/2015    Past Medical History:  Diagnosis Date   Anemia    Heart murmur     Family History  Problem Relation Age of Onset   Cancer Maternal Grandfather    Brain cancer Paternal Grandfather    Breast cancer Neg Hx    Past Surgical History:  Procedure Laterality Date   BREAST REDUCTION SURGERY  2022   w/ tummy tuck   ROBOTIC ASSISTED TOTAL HYSTERECTOMY WITH BILATERAL SALPINGO OOPHERECTOMY Bilateral 04/05/2019   Procedure: XI ROBOTIC ASSISTED TOTAL HYSTERECTOMY WITH BILATERAL SALPINGECTOMY AND CYSTOSCOPY;  Surgeon: Corene Coy, MD;  Location: [facility];  Service: Gynecology;  Laterality: Bilateral;   TUBAL LIGATION     Social History   Social History Narrative   ** Merged History Encounter **        There is no immunization history on file for this patient.   Objective: Vital Signs: BP 108/76 (BP Location: Right Arm, Patient Position: Sitting, Cuff Size: Normal)   Pulse 73   Temp 97.6 F (36.4 C)   Resp 15   Ht 5' 4.75 (1.645 m)   Wt 130 lb 3.2 oz (59.1 kg)   LMP 03/23/2019   BMI 21.83 kg/m    Physical Exam Vitals and nursing note reviewed.  HENT:     Head: Normocephalic and atraumatic.     Nose: Nose normal.  Eyes:     Conjunctiva/sclera: Conjunctivae normal.     Pupils: Pupils are equal, round, and reactive to light.  Cardiovascular:     Rate and Rhythm: Normal rate and regular rhythm.  Pulmonary:     Effort: Pulmonary effort is normal. No respiratory distress.  Abdominal:     General: There is no distension.     Palpations: Abdomen is soft.     Tenderness: There is no  abdominal tenderness. There is no guarding or rebound.  Musculoskeletal:        General: No tenderness (no tenderness to anterior chest wall or ribcage bilaterally).  Skin:    General: Skin is warm and dry.  Neurological:     Mental Status: She is alert. Mental status is at baseline.  Psychiatric:        Mood and Affect: Mood normal.        Behavior: Behavior normal.      Musculoskeletal Exam:   CDAI Exam: CDAI Score: -- Patient Global: --; Provider Global: -- Swollen: 0 ; Tender: 0  Joint Exam 10/12/2023   All documented joints were normal     Investigation: No additional findings.  Imaging: No results found.  Recent Labs: Lab Results  Component Value Date   WBC 5.2 03/30/2019   HGB 10.0 (L) 03/30/2019   PLT 414 (H) 03/30/2019   NA 137 07/25/2018   K 3.3 (L) 07/25/2018   CL 102 07/25/2018   CO2 24 07/25/2018   GLUCOSE 102 (H) 07/25/2018  BUN 13 07/25/2018   CREATININE 0.93 07/25/2018   CALCIUM 9.5 07/25/2018   GFRAA >60 07/25/2018   No results found for: ANA, RF 08/13/23: ANA 1:40, negative CRP and ESR, negative dsDNA  Speciality Comments: No specialty comments available.  Procedures:  No procedures performed Allergies: Elemental sulfur and Sulfa antibiotics   Assessment / Plan:     Visit Diagnoses:   Positive ANA (antinuclear antibody) Patient presenting with low titer positive ANA (1:40) and anterior chest pain discussed below. Patient does not have any features of SLE (no objective malar rash, inflammatory arthritis, Raynaud's, alopecia, recurrent cytopenias), rheumatoid arthritis (no evidence of synovitis, negative RF/CCP), Sjogren's disease (no sicca symptoms), scleroderma (no sclerodactyly, no Raynaud's), seronegative spondyloarthropathy (no enthesitis, no psoriasis, no hx of IBD).   As discussed with patient, a positive ANA need not represent presence of clinically active systemic autoimmune disease.  Can be positive in the normal population,  autoimmune thyroid disease (Graves' disease, Hashimoto's thyroiditis etc.), or infection. ANA can be positive in the healthy population at the following rates: ANA 1:40: 20%-30%, ANA 1:80: 10%-15%, ANA 1:160: 5%, ANA 1:320: 3% positive, healthy relative of an SLE patient: 5%-25% positive (usually low titers), and elderly (age >70 years): up to 70% positive at ANA titer 1:40.  Anterior chest wall pain LUQ and epigastric pain Patient with recurrent pain of anterior chest wall, mostly on the left lower rib cage/LUQ and epigastric region under the sternum. As discussed with patient, I am not sure exactly what caused her initial severe pain or residual pain that appears more episodic and less severe in nature. As discussed with patient, I cannot state definitively that her pain is not due to costochondritis, but it typically is more localized to the sternum, which was not the case for this patient. However, it is odd that her symptoms improved with prednisone, which does make it more likely to be inflammation/irritation of the soft tissue/cartilage. However, as discussed with patient, isolated costochondritis is not associated with specific rheumatologic conditions and is typically managed conservatively.   One other possibility could be that her symptoms are somehow related to her underlying scoliosis. There could be some underlying irritation of the lower ribcage given abnormal curvature of the spinal column. However, this is lower on the differential.   I do worry that there could be some component of gallbladder disease and/or esophagitis/gastritis given the symptoms appear to also be on the abdomen. Will forward recommendations to PCP to consider U/S gallbladder and/or trial of PPI to see if this improves her symptoms.  Recurrent infections  Patient concerned given hx of recurrent infections. Will check IgG, IgA, IgM today. Pending results, will possible refer to immunology for further  evaluation/management.  Orders: Orders Placed This Encounter  Procedures   IgG, IgA, IgM   No orders of the defined types were placed in this encounter.   I personally spent a total of 60 minutes in the care of the patient today including preparing to see the patient, getting/reviewing separately obtained history, performing a medically appropriate exam/evaluation, counseling and educating, placing orders, and documenting clinical information in the EHR.  Follow-Up Instructions: Return if symptoms worsen or fail to improve.   Asberry Claw, DO

## 2023-10-23 DIAGNOSIS — M4125 Other idiopathic scoliosis, thoracolumbar region: Secondary | ICD-10-CM | POA: Diagnosis not present

## 2023-10-23 DIAGNOSIS — F32A Depression, unspecified: Secondary | ICD-10-CM | POA: Diagnosis not present

## 2023-10-23 DIAGNOSIS — G479 Sleep disorder, unspecified: Secondary | ICD-10-CM | POA: Diagnosis not present

## 2023-10-23 DIAGNOSIS — R1032 Left lower quadrant pain: Secondary | ICD-10-CM | POA: Diagnosis not present

## 2023-10-23 DIAGNOSIS — E559 Vitamin D deficiency, unspecified: Secondary | ICD-10-CM | POA: Diagnosis not present

## 2023-10-23 DIAGNOSIS — D509 Iron deficiency anemia, unspecified: Secondary | ICD-10-CM | POA: Diagnosis not present

## 2023-10-23 DIAGNOSIS — B002 Herpesviral gingivostomatitis and pharyngotonsillitis: Secondary | ICD-10-CM | POA: Diagnosis not present

## 2023-10-23 DIAGNOSIS — E785 Hyperlipidemia, unspecified: Secondary | ICD-10-CM | POA: Diagnosis not present

## 2023-11-12 ENCOUNTER — Other Ambulatory Visit: Payer: Self-pay

## 2023-11-12 DIAGNOSIS — K5909 Other constipation: Secondary | ICD-10-CM | POA: Diagnosis not present

## 2023-11-12 DIAGNOSIS — R1012 Left upper quadrant pain: Secondary | ICD-10-CM

## 2023-11-12 DIAGNOSIS — Z1211 Encounter for screening for malignant neoplasm of colon: Secondary | ICD-10-CM | POA: Diagnosis not present

## 2023-11-12 DIAGNOSIS — R112 Nausea with vomiting, unspecified: Secondary | ICD-10-CM | POA: Diagnosis not present

## 2023-11-20 ENCOUNTER — Other Ambulatory Visit

## 2023-11-23 ENCOUNTER — Other Ambulatory Visit

## 2023-11-30 ENCOUNTER — Inpatient Hospital Stay: Admission: RE | Admit: 2023-11-30 | Source: Ambulatory Visit

## 2023-12-10 ENCOUNTER — Inpatient Hospital Stay: Admission: RE | Admit: 2023-12-10 | Discharge: 2023-12-10 | Disposition: A | Source: Ambulatory Visit

## 2023-12-10 DIAGNOSIS — K8020 Calculus of gallbladder without cholecystitis without obstruction: Secondary | ICD-10-CM | POA: Diagnosis not present

## 2023-12-10 DIAGNOSIS — R1012 Left upper quadrant pain: Secondary | ICD-10-CM

## 2023-12-10 MED ORDER — IOPAMIDOL (ISOVUE-370) INJECTION 76%
80.0000 mL | Freq: Once | INTRAVENOUS | Status: AC | PRN
  Administered 2023-12-10: 80 mL via INTRAVENOUS
